# Patient Record
Sex: Female | Born: 1975 | Hispanic: No | Marital: Married | Smoking: Never smoker
Health system: Southern US, Community
[De-identification: ages and names within clinical notes are randomized; demographics above are authoritative.]

## PROBLEM LIST (undated history)

## (undated) DIAGNOSIS — I10 Essential (primary) hypertension: Secondary | ICD-10-CM

## (undated) DIAGNOSIS — E785 Hyperlipidemia, unspecified: Secondary | ICD-10-CM

## (undated) DIAGNOSIS — R51 Headache: Secondary | ICD-10-CM

## (undated) DIAGNOSIS — R519 Headache, unspecified: Secondary | ICD-10-CM

## (undated) DIAGNOSIS — T7840XA Allergy, unspecified, initial encounter: Secondary | ICD-10-CM

## (undated) DIAGNOSIS — G43909 Migraine, unspecified, not intractable, without status migrainosus: Secondary | ICD-10-CM

## (undated) HISTORY — PX: TUBAL LIGATION: SHX77

## (undated) HISTORY — PX: ABDOMINAL HYSTERECTOMY: SHX81

## (undated) HISTORY — DX: Hyperlipidemia, unspecified: E78.5

## (undated) HISTORY — DX: Allergy, unspecified, initial encounter: T78.40XA

---
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
## 1999-01-25 ENCOUNTER — Encounter (HOSPITAL_COMMUNITY): Admission: RE | Admit: 1999-01-25 | Discharge: 1999-04-20 | Payer: Self-pay | Admitting: *Deleted

## 2003-10-19 ENCOUNTER — Emergency Department (HOSPITAL_COMMUNITY): Admission: EM | Admit: 2003-10-19 | Discharge: 2003-10-19 | Payer: Self-pay | Admitting: Emergency Medicine

## 2012-09-27 ENCOUNTER — Ambulatory Visit: Payer: Self-pay | Admitting: Obstetrics and Gynecology

## 2012-09-27 LAB — CBC
HCT: 37.6 % (ref 35.0–47.0)
HGB: 12.8 g/dL (ref 12.0–16.0)
MCH: 28 pg (ref 26.0–34.0)
MCHC: 34 g/dL (ref 32.0–36.0)
MCV: 82 fL (ref 80–100)
Platelet: 283 10*3/uL (ref 150–440)
RBC: 4.58 10*6/uL (ref 3.80–5.20)
RDW: 13.4 % (ref 11.5–14.5)
WBC: 7.4 10*3/uL (ref 3.6–11.0)

## 2012-09-27 LAB — POTASSIUM: Potassium: 3.8 mmol/L (ref 3.5–5.1)

## 2012-10-12 ENCOUNTER — Ambulatory Visit: Payer: Self-pay | Admitting: Obstetrics and Gynecology

## 2012-10-16 LAB — PATHOLOGY REPORT

## 2013-10-06 ENCOUNTER — Encounter (HOSPITAL_COMMUNITY): Payer: Self-pay | Admitting: Emergency Medicine

## 2013-10-06 ENCOUNTER — Emergency Department (HOSPITAL_COMMUNITY): Payer: Worker's Compensation

## 2013-10-06 ENCOUNTER — Emergency Department (HOSPITAL_COMMUNITY)
Admission: EM | Admit: 2013-10-06 | Discharge: 2013-10-06 | Disposition: A | Discharge status: Home / Self Care | Payer: Worker's Compensation | Attending: Emergency Medicine | Admitting: Emergency Medicine

## 2013-10-06 DIAGNOSIS — X500XXA Overexertion from strenuous movement or load, initial encounter: Secondary | ICD-10-CM | POA: Insufficient documentation

## 2013-10-06 DIAGNOSIS — Y929 Unspecified place or not applicable: Secondary | ICD-10-CM | POA: Insufficient documentation

## 2013-10-06 DIAGNOSIS — IMO0002 Reserved for concepts with insufficient information to code with codable children: Secondary | ICD-10-CM | POA: Diagnosis not present

## 2013-10-06 DIAGNOSIS — I10 Essential (primary) hypertension: Secondary | ICD-10-CM | POA: Diagnosis not present

## 2013-10-06 DIAGNOSIS — S46909A Unspecified injury of unspecified muscle, fascia and tendon at shoulder and upper arm level, unspecified arm, initial encounter: Secondary | ICD-10-CM | POA: Insufficient documentation

## 2013-10-06 DIAGNOSIS — S63501A Unspecified sprain of right wrist, initial encounter: Secondary | ICD-10-CM

## 2013-10-06 DIAGNOSIS — Y9389 Activity, other specified: Secondary | ICD-10-CM | POA: Insufficient documentation

## 2013-10-06 DIAGNOSIS — S4980XA Other specified injuries of shoulder and upper arm, unspecified arm, initial encounter: Secondary | ICD-10-CM | POA: Diagnosis present

## 2013-10-06 HISTORY — DX: Essential (primary) hypertension: I10

## 2013-10-06 HISTORY — DX: Headache: R51

## 2013-10-06 HISTORY — DX: Headache, unspecified: R51.9

## 2013-10-06 MED ORDER — KETOROLAC TROMETHAMINE 10 MG PO TABS
10.0000 mg | ORAL_TABLET | Freq: Once | ORAL | Status: AC
  Administered 2013-10-06: 10 mg via ORAL
  Filled 2013-10-06: qty 1

## 2013-10-06 MED ORDER — HYDROCODONE-ACETAMINOPHEN 5-325 MG PO TABS: 1.0000 | ORAL_TABLET | ORAL | Status: DC | PRN

## 2013-10-06 MED ORDER — HYDROCODONE-ACETAMINOPHEN 5-325 MG PO TABS
2.0000 | ORAL_TABLET | Freq: Once | ORAL | Status: AC
  Administered 2013-10-06: 2 via ORAL
  Filled 2013-10-06: qty 2

## 2013-10-06 MED ORDER — DICLOFENAC SODIUM 75 MG PO TBEC: 75.0000 mg | DELAYED_RELEASE_TABLET | Freq: Two times a day (BID) | ORAL | Status: DC

## 2013-10-06 NOTE — ED Provider Notes (Signed)
CSN: [id number]     Arrival date & time 10/06/13  1740 History  This chart was scribed for a non-physician practitioner, Ivery Quale, PA-C, working with Vida Roller, MD by [place] Peace, ED Scribe. The patient was seen in APFT20/APFT20. The patient's care was started at 7:11 PM.    Chief Complaint  Patient presents with  . Arm Pain      The history is provided by the patient. No language interpreter was used.   HPI Comments: Victoria Brandt is a 38 y.o. female who presents to the Emergency Department complaining of right hand/forearm injury that occurred earlier today while she was at work and a resident twisted her right arm while she was passing out medication. Pt complains of pain and swelling to her right thumb and wrist area specifically with some numbness as well. She has not taken any medication pta.    Past Medical History  Diagnosis Date  . Hypertension   . Headache    Past Surgical History  Procedure Laterality Date  . Abdominal hysterectomy    . Tubal ligation     No family history on file. History  Substance Use Topics  . Smoking status: Never Smoker   . Smokeless tobacco: Not on file  . Alcohol Use: No   OB History   Grav Para Term Preterm Abortions TAB SAB Ect Mult Living                 Review of Systems  Constitutional: Negative for fever.  Gastrointestinal: Negative for nausea, vomiting and diarrhea.  Musculoskeletal:       Right hand/wrist pain.   Neurological: Positive for numbness (right arm).      Allergies  Bee venom  Home Medications   Prior to Admission medications   Medication Sig Start Date End Date Taking? Authorizing Provider  losartan-hydrochlorothiazide (HYZAAR) 50-12.5 MG per tablet Take 1 tablet by mouth every evening.   Yes Historical Provider, MD  SUMAtriptan (IMITREX) 100 MG tablet Take 100 mg by mouth every 2 (two) hours as needed for migraine or headache. May repeat in 2 hours if headache persists or recurs.   Yes  Historical Provider, MD   BP 177/101  Pulse 74  Temp(Src) 99.3 F (37.4 C) (Oral)  Resp 18  Ht  (1.6 m)  Wt 218 lb (98.884 kg)  BMI 38.63 kg/m2  SpO2 99% Physical Exam  Nursing note and vitals reviewed. Constitutional: She is oriented to person, place, and time. She appears well-developed and well-nourished. No distress.  HENT:  Head: Normocephalic and atraumatic.  Eyes: Conjunctivae and EOM are normal.  Neck: Neck supple. No tracheal deviation present.  Cardiovascular: Normal rate.   Pulmonary/Chest: Effort normal. No respiratory distress.  Musculoskeletal: Normal range of motion.  Full ROM of the right shoulder and right elbow. Tenderness to touch of radial portion of right forearm. No broken skin. RP 2+. Good ROM of fingers. Pain with flexion and extension of the fingers. Mild swelling of the dorsum of the hands. Pain with pronation and supination.   Neurological: She is alert and oriented to person, place, and time.  Skin: Skin is warm and dry.  Psychiatric: She has a normal mood and affect. Her behavior is normal.    ED Course  Procedures (including critical care time) Labs Review Labs Reviewed - No data to display  No results found for this or any previous visit. No results found.    Imaging Review Dg Wrist Complete Right  10/06/2013   CLINICAL DATA:  Right wrist pain and swelling following an injury today.  EXAM: RIGHT WRIST - COMPLETE 3+ VIEW  COMPARISON:  Right hand radiographs obtained at the same time.  FINDINGS: There is no evidence of fracture or dislocation. There is no evidence of arthropathy or other focal bone abnormality. Soft tissues are unremarkable.  IMPRESSION: Normal examination.   Electronically Signed   By: Gordan Payment M.D.   On: 10/06/2013 18:09   Dg Hand Complete Right  10/06/2013   CLINICAL DATA:  Hand pain and swelling  EXAM: RIGHT HAND - COMPLETE 3+ VIEW  COMPARISON:  None.  FINDINGS: Three views of the right hand submitted. No acute  fracture or subluxation. Mild degenerative changes first metacarpophalangeal joint. Mild degenerative changes distal interphalangeal joints second third and fifth finger.  IMPRESSION: No acute fracture or subluxation. Mild degenerative changes first metacarpophalangeal joint and distal interphalangeal joints.   Electronically Signed   By: Natasha Mead M.D.   On: 10/06/2013 18:12     EKG Interpretation None     Medications - No data to display  7:16 PM- Treatment plan was discussed with patient who verbalizes understanding and agrees. Pt was given x-ray results as well.   MDM X-ray of the right hand is negative for fracture or subluxation. There are degenerative changes of the MCP and DIP joints.  X-ray of the right wrist is negative for fracture or dislocation.  The examination is consistent with muscle strains sprain of the wrist forearm area. Patient is placed in the wrist forearm splint and sling. Prescription for diclofenac and Norco given to the patient. Patient referred to orthopedics for additional followup. The patient has received a work excuse to return on August 28.    Final diagnoses:  None    *I have reviewed nursing notes, vital signs, and all appropriate lab and imaging results for this patient.**  **I personally performed the services described in this documentation, which was scribed in my presence. The recorded information has been reviewed and is accurate.Kathie Dike, PA-C 10/06/13 2018

## 2013-10-06 NOTE — Discharge Instructions (Signed)
Your examination is consistent with a wrist forearm sprain. Please use the splint and sling for the next 7-10 days. Please use diclofenac 2 times daily use Norco every 4 hours if needed for pain. Norco may cause drowsiness, please use with caution. Please see Dr. Romeo Apple for additional evaluation in the office. Wrist Splint A wrist splint holds your wrist in a set position so that it does not move (fixed position). It can help broken bones and sprains heal faster, with less pain. It can also help relieve pressure on the nerve that runs down the middle of your arm (median nerve) into your fingers.  HOME CARE  Wear your splint as told by your doctor. It may be worn while you sleep.  Exercise your wrist as told by your doctor. These exercises help keep muscle strength in your hand and wrist. They also help to make sure you keep motion in your fingers. GET HELP RIGHT AWAY IF:   You start to lose feeling in your hand or fingers.  Your skin or fingernails turn blue or gray, or they feel cold. MAKE SURE YOU:   Understand these instructions.  Will watch your condition.  Will get help right away if you are not doing well or get worse. Document Released: 07/20/2007 Document Revised: 04/25/2011 Document Reviewed: 05/14/2013 [facility] Patient Information 2015 [place], [place]. This information is not intended to replace advice given to you by your health care provider. Make sure you discuss any questions you have with your health care provider.

## 2013-10-06 NOTE — ED Notes (Signed)
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 Pt reports tingling in arm.  Pt has not taken anything for pain. Swelling is noted and pt is able to move fingers.

## 2013-10-06 NOTE — ED Notes (Signed)
Pt was at work today when a resident twisted her right arm, pt c/o pain and swelling to right thumb and wrist area, cms intact distal

## 2013-10-07 NOTE — ED Provider Notes (Signed)
Medical screening examination/treatment/procedure(s) were performed by non-physician practitioner and as supervising physician I was immediately available for consultation/collaboration.    Vida Roller, MD 10/07/13 [phone]

## 2013-10-10 ENCOUNTER — Telehealth: Payer: Self-pay | Admitting: Orthopedic Surgery

## 2013-10-10 NOTE — Telephone Encounter (Signed)
Patient requests apppointment following [facility] visit, for work-related injury, of 10/06/13; discussed process of additional information and forms required prior to scheduling. Patient came to office to pick up forms; will have employer, [employer], to contact our office regarding forms and whether approved to be scheduled with our office, based on Workers [employer]  Patient aware appointment is pending this information. Patient's ph# is [phone]

## 2013-10-14 NOTE — Telephone Encounter (Signed)
Patient's employer, [employer], has the Workers Comp forms; awaiting to be completed; patient has brought in the employee/patient portion; appointment still pending.  Message left at employer, [employer], for [employer]; also for [employer].

## 2013-10-16 NOTE — Telephone Encounter (Signed)
Received employer's worker's comp information and claim# [id number].  Appointment scheduled 10/17/13, 2:00p.m.

## 2013-10-17 ENCOUNTER — Encounter: Payer: Self-pay | Admitting: Orthopedic Surgery

## 2013-10-17 ENCOUNTER — Ambulatory Visit (INDEPENDENT_AMBULATORY_CARE_PROVIDER_SITE_OTHER): Payer: Worker's Compensation | Admitting: Orthopedic Surgery

## 2013-10-17 VITALS — BP 143/91 | Ht 63.0 in | Wt 218.0 lb

## 2013-10-17 DIAGNOSIS — S63501A Unspecified sprain of right wrist, initial encounter: Secondary | ICD-10-CM

## 2013-10-17 DIAGNOSIS — S63509A Unspecified sprain of unspecified wrist, initial encounter: Secondary | ICD-10-CM

## 2013-10-17 MED ORDER — HYDROCODONE-ACETAMINOPHEN 5-325 MG PO TABS: 1.0000 | ORAL_TABLET | Freq: Four times a day (QID) | ORAL | Status: DC | PRN

## 2013-10-17 MED ORDER — IBUPROFEN 800 MG PO TABS: 800.0000 mg | ORAL_TABLET | Freq: Three times a day (TID) | ORAL | Status: DC | PRN

## 2013-10-17 NOTE — Progress Notes (Signed)
Chief Complaint  Patient presents with  . 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 She's now 11 days post injury and still complaining of pain and stiffness numbness along the radial aspect of her right wrist primarily over the first extensor compartment  Review of systems  Medical history  Physical exam BP 143/91  Ht  (1.6 m)  Wt 218 lb (98.884 kg)  BMI 38.63 kg/m2 Normal development grooming and hygiene. Mood affect normal. A normal. Right wrist tender swollen she is holding onto her very tightly she is very apprehensive painful range of motion in all planes no instability cannot test wrist strength chest opening closing maneuvers which are normal for the hand skin is intact has good pulses no lymphadenopathy normal sensation  X-rays negative  Wrist sprain  Recommend occupational therapy, continue splinting  Return to work light duty if available if not continue off work. Occupational therapy twice a week for 4 weeks (No lifting right hand greater than 2 pounds) Continue medications as follows

## 2013-10-17 NOTE — Patient Instructions (Signed)
Return to work with brace 10/19/13, light duty, no lifting over 2 lbs with right hand Call to arrange therapy

## 2013-10-22 ENCOUNTER — Telehealth: Payer: Self-pay | Admitting: Orthopedic Surgery

## 2013-10-22 NOTE — Telephone Encounter (Addendum)
Contacted Workers comp, [employer], regarding requesting approval for physical therapy, ph# [phone]. Given adjuster ph# [phone]. Fax notes and order to fax# [phone] (sent 10/23/13).  Awaiting response.  Patient aware of status.

## 2013-10-30 NOTE — Telephone Encounter (Signed)
Called patient to inquire if she has been contacted by workers comp regarding physical therapy; she has not.  I have called back to [facility], left message to return call.

## 2013-11-12 ENCOUNTER — Telehealth: Payer: Self-pay | Admitting: Orthopedic Surgery

## 2013-11-12 NOTE — Telephone Encounter (Signed)
On 11/11/13, we contacted patient to notify of appointment change, due to change in provider schedule; offered same day at another time, also offered 11/13/13, as well as 11/18/13, none of which worked with patient's class schedule.  Patient opted to move it to 11/28/13.  Updated work note issued accordingly.

## 2013-11-14 ENCOUNTER — Ambulatory Visit: Payer: Worker's Compensation | Admitting: Orthopedic Surgery

## 2013-11-18 NOTE — Telephone Encounter (Signed)
11/18/13 Received voice message from [employer] representative, ph# [phone] - left another voice message in response.

## 2013-11-19 ENCOUNTER — Other Ambulatory Visit: Payer: Self-pay | Admitting: *Deleted

## 2013-11-19 DIAGNOSIS — S63501D Unspecified sprain of right wrist, subsequent encounter: Secondary | ICD-10-CM

## 2013-11-19 NOTE — Telephone Encounter (Signed)
Per [employer], Workers Comp's 3rd party, need the therapy order to be "Physical Therapy", per request of the approved provider, [facility], [place] (order is currently entered as "occupational" if patient were to schedule at [facility].  Re-Fax to #[phone]

## 2013-11-19 NOTE — Telephone Encounter (Signed)
Therapy approved per [employer] insurance adjuster for [employer], Wardell HonourLaura Keck, ph [phone]. [employer], 3rd party contact, requests orders -- faxed to [phone],ph# [phone];  Provider information to follow.  Tried calling patient to notify - no voice mail set up.

## 2013-11-20 NOTE — Telephone Encounter (Signed)
Order had been updated per Harvel RicksJaime Booth, LPN to: "Physical Therapy" on 11/19/13 and [facility], who has forwarded copy to provider, [facility]. Messages have been left to notify patient.

## 2013-11-28 ENCOUNTER — Encounter: Payer: Self-pay | Admitting: Orthopedic Surgery

## 2013-11-28 ENCOUNTER — Ambulatory Visit (INDEPENDENT_AMBULATORY_CARE_PROVIDER_SITE_OTHER): Payer: Worker's Compensation | Admitting: Orthopedic Surgery

## 2013-11-28 VITALS — BP 158/98 | Ht 63.0 in | Wt 218.0 lb

## 2013-11-28 DIAGNOSIS — S63501A Unspecified sprain of right wrist, initial encounter: Secondary | ICD-10-CM

## 2013-11-28 MED ORDER — HYDROCODONE-ACETAMINOPHEN 5-325 MG PO TABS: 1.0000 | ORAL_TABLET | Freq: Four times a day (QID) | ORAL | Status: DC | PRN

## 2013-11-28 NOTE — Patient Instructions (Signed)
No change in work status  Start therapy

## 2013-11-28 NOTE — Progress Notes (Signed)
Chief Complaint  Patient presents with  . Follow-up    4 week recheck right wrist in brace s/p therapy DOI 10/06/13    The patient had any injury to her right wrist at work. We ordered therapy, Norco and ibuprofen and we placed her brace. She did not start therapy.  She still complains of pain weakness with pain over the right wrist right excision versus extensor compartment right wrist joint  Review of systems negative  BP 158/98  Ht 5\' 3"  (1.6 m)  Wt 218 lb (98.884 kg)  BMI 38.63 kg/m2 She has tenderness over the wrist joint and over the first extensor compartment. Finkelstein's test is positive for pain she has painful wrist range of motion which is normal. She has weak grip strength and intention tremor. Neurovascular exam is intact. Claudette LawsWatson test is negative for any instability.  Wrist joint injury with de Quervain's syndrome  Recommend continue wrist splinting, ibuprofen, Norco, start therapy. She indicates therapy is scheduled for the 21st I'll see her 4 weeks after that  Continue modified work duty and continue ibuprofen and Norco and splinting

## 2014-01-02 ENCOUNTER — Encounter: Payer: Self-pay | Admitting: Orthopedic Surgery

## 2014-01-02 ENCOUNTER — Ambulatory Visit (INDEPENDENT_AMBULATORY_CARE_PROVIDER_SITE_OTHER): Payer: Worker's Compensation | Admitting: Orthopedic Surgery

## 2014-01-02 ENCOUNTER — Telehealth: Payer: Self-pay | Admitting: Orthopedic Surgery

## 2014-01-02 VITALS — BP 163/92 | Ht 63.0 in | Wt 218.0 lb

## 2014-01-02 DIAGNOSIS — S63501D Unspecified sprain of right wrist, subsequent encounter: Secondary | ICD-10-CM

## 2014-01-02 MED ORDER — HYDROCODONE-ACETAMINOPHEN 5-325 MG PO TABS: 1.0000 | ORAL_TABLET | ORAL | Status: DC | PRN

## 2014-01-02 MED ORDER — IBUPROFEN 800 MG PO TABS: 800.0000 mg | ORAL_TABLET | Freq: Three times a day (TID) | ORAL | Status: DC | PRN

## 2014-01-02 MED ORDER — HYDROCODONE-ACETAMINOPHEN 5-325 MG PO TABS: 1.0000 | ORAL_TABLET | Freq: Four times a day (QID) | ORAL | Status: DC | PRN

## 2014-01-02 NOTE — Patient Instructions (Signed)
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

## 2014-01-02 NOTE — Telephone Encounter (Addendum)
Contacted [employer], workers Emergency planning/management officercomp insurer, left message with Wardell HonourLaura Keck, ph [phone]. who will contact [employer], 3rd party contact, requests orders -- then to be faxed to [phone],ph# [phone] * Note: Per Alcario DroughtErica at [facility], they are no longer following this claim; therefore, called back to insurer, [employer] left message at [phone]; faxed notes directly to [phone].

## 2014-01-02 NOTE — Progress Notes (Signed)
Patient ID: Victoria Brandt, female   DOB: 06/28/1974, 38 y.o.   MRN: [id number] Chief Complaint  Patient presents with  . Follow-up    5 week recheck right wrist in brace, DOI 10/06/13    Encounter Diagnosis  Name Primary?  . Right wrist sprain, subsequent encounter Yes    Follow-up visit after workers compensation injury she finally got her therapy she made improvement I reviewed her therapy notes she's almost home.  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 DISCONTD: HYDROcodone-acetaminophen (NORCO/VICODIN) 5-325 MG per tablet    Sig: Take 1 tablet by mouth every 4 (four) hours as needed.    Dispense:  20 tablet    Refill:  0  . ibuprofen (ADVIL,MOTRIN) 800 MG tablet    Sig: Take 1 tablet (800 mg total) by mouth every 8 (eight) hours as needed.    Dispense:  90 tablet    Refill:  5  . HYDROcodone-acetaminophen (NORCO/VICODIN) 5-325 MG per tablet    Sig: Take 1 tablet by mouth every 6 (six) hours as needed for moderate pain.    Dispense:  56 tablet    Refill:  0   -

## 2014-01-08 ENCOUNTER — Encounter: Payer: Self-pay | Admitting: Orthopedic Surgery

## 2014-01-08 NOTE — Telephone Encounter (Signed)
Note received from [employer], Workers Comp 3rd party contact regarding Physical/Occupational therapy. Faxed copy of order, Fax# [phone] / [phone].  Awaiting approval and provider information.  Patient aware.

## 2014-01-15 NOTE — Telephone Encounter (Signed)
Received faxed note from [employer], 3rd party contact for patient's worker's comp insurer indicating that patient is approved for occupational therapy at: [facility], [address], [place], [phone]/[place][phone]/ Fax# [phone].  Patient aware of appointment, scheduled for 01/21/14, 8:30AM. Order faxed.

## 2014-01-30 ENCOUNTER — Ambulatory Visit (INDEPENDENT_AMBULATORY_CARE_PROVIDER_SITE_OTHER): Payer: Worker's Compensation | Admitting: Orthopedic Surgery

## 2014-01-30 ENCOUNTER — Encounter: Payer: Self-pay | Admitting: Orthopedic Surgery

## 2014-01-30 VITALS — BP 151/94 | Ht 63.0 in | Wt 218.0 lb

## 2014-01-30 DIAGNOSIS — S63501D Unspecified sprain of right wrist, subsequent encounter: Secondary | ICD-10-CM

## 2014-01-30 NOTE — Progress Notes (Signed)
Patient ID: Victoria Brandt, female   DOB: 06/28/1974, 38 y.o.   MRN: [id number] Chief Complaint  Patient presents with  . Follow-up    4 week recheck right wrist  s/p therapy, DOI 10/06/13    The patient is undergoing treatment for her right wrist injury back in August. She is switched therapy departments and is doing much better with 65-75% overall improvement  He is just a little tender over the first extensor compartment decreased extension and flexion of the thumb but the thumb is stable she has excellent strength in the thumb skin is intact she has good pulse normal sensation  Continue therapy 4 weeks return in 4 weeks continue ibuprofen and Norco for discomfort or pain

## 2014-01-31 ENCOUNTER — Telehealth: Payer: Self-pay | Admitting: Orthopedic Surgery

## 2014-01-31 NOTE — Telephone Encounter (Signed)
Notes faxed, date of service 01/30/14 to Workers comp insurer, [employer], fax#  [phone], including order for additional therapy visits - patient currently in occupational therapy (approved by Workers comp) for provider: [facility], [address], [place], [place], ph# [phone], fax # [phone].

## 2014-02-01 ENCOUNTER — Emergency Department (HOSPITAL_COMMUNITY): Payer: BC Managed Care – PPO

## 2014-02-01 ENCOUNTER — Emergency Department (HOSPITAL_COMMUNITY)
Admission: EM | Admit: 2014-02-01 | Discharge: 2014-02-02 | Disposition: A | Discharge status: Home / Self Care | Payer: BC Managed Care – PPO | Attending: Emergency Medicine | Admitting: Emergency Medicine

## 2014-02-01 ENCOUNTER — Encounter (HOSPITAL_COMMUNITY): Payer: Self-pay | Admitting: Emergency Medicine

## 2014-02-01 DIAGNOSIS — S8992XA Unspecified injury of left lower leg, initial encounter: Secondary | ICD-10-CM | POA: Diagnosis present

## 2014-02-01 DIAGNOSIS — S80212A Abrasion, left knee, initial encounter: Secondary | ICD-10-CM

## 2014-02-01 DIAGNOSIS — S8002XA Contusion of left knee, initial encounter: Secondary | ICD-10-CM | POA: Insufficient documentation

## 2014-02-01 DIAGNOSIS — Z791 Long term (current) use of non-steroidal anti-inflammatories (NSAID): Secondary | ICD-10-CM | POA: Diagnosis not present

## 2014-02-01 DIAGNOSIS — Y9389 Activity, other specified: Secondary | ICD-10-CM | POA: Insufficient documentation

## 2014-02-01 DIAGNOSIS — S6991XA Unspecified injury of right wrist, hand and finger(s), initial encounter: Secondary | ICD-10-CM | POA: Diagnosis not present

## 2014-02-01 DIAGNOSIS — W108XXA Fall (on) (from) other stairs and steps, initial encounter: Secondary | ICD-10-CM | POA: Diagnosis not present

## 2014-02-01 DIAGNOSIS — S93402A Sprain of unspecified ligament of left ankle, initial encounter: Secondary | ICD-10-CM | POA: Diagnosis not present

## 2014-02-01 DIAGNOSIS — Z23 Encounter for immunization: Secondary | ICD-10-CM | POA: Insufficient documentation

## 2014-02-01 DIAGNOSIS — I10 Essential (primary) hypertension: Secondary | ICD-10-CM | POA: Insufficient documentation

## 2014-02-01 DIAGNOSIS — Y998 Other external cause status: Secondary | ICD-10-CM | POA: Insufficient documentation

## 2014-02-01 DIAGNOSIS — Y9289 Other specified places as the place of occurrence of the external cause: Secondary | ICD-10-CM | POA: Diagnosis not present

## 2014-02-01 DIAGNOSIS — G43909 Migraine, unspecified, not intractable, without status migrainosus: Secondary | ICD-10-CM | POA: Insufficient documentation

## 2014-02-01 DIAGNOSIS — M25531 Pain in right wrist: Secondary | ICD-10-CM

## 2014-02-01 DIAGNOSIS — W19XXXA Unspecified fall, initial encounter: Secondary | ICD-10-CM

## 2014-02-01 HISTORY — DX: Migraine, unspecified, not intractable, without status migrainosus: G43.909

## 2014-02-01 NOTE — ED Notes (Signed)
Pt reports falling down approx 6 steps. Pt c/o LLE pain and wrist and forearm pain.

## 2014-02-01 NOTE — ED Provider Notes (Signed)
CSN: [id number]     Arrival date & time 02/01/14  2209 History   None    Chief Complaint  Patient presents with  . Fall     (Consider location/radiation/quality/duration/timing/severity/associated sxs/prior Treatment) Patient is a 38 y.o. female presenting with fall. The history is provided by the patient.  Fall This is a new problem. The current episode started today. The problem has been gradually worsening. The symptoms are aggravated by walking. She has tried nothing for the symptoms.   Victoria Brandt is a 38 y.o. female who presents to the ED after a fall. She reports slipping and falling down about 6 steps. She complains of LLE pain and wrist and forearm pain. The injury occurred about 2 hours prior to arrival to the ED. She is currently under the care of ortho for a right wrist injury and is wearing a wrist splint. She states she did not have the splint on at the time of the fall. She re injured the right wrist and complains of pain to the left ankle and knee. She has an abrasion to the left knee. She is unsure of her last tetanus. She denies LOC. She denies neck or back pain. States she fell on her left side.   Past Medical History  Diagnosis Date  . Hypertension   . Headache   . Migraines    Past Surgical History  Procedure Laterality Date  . Abdominal hysterectomy    . Tubal ligation     Family History  Problem Relation Age of Onset  . Hypertension Mother   . Rheum arthritis Father   . Hypertension Father   . Heart failure Sister   . Migraines Sister   . Hypertension Sister   . Thyroid disease Sister    History  Substance Use Topics  . Smoking status: Never Smoker   . Smokeless tobacco: Not on file  . Alcohol Use: No   OB History    Gravida Para Term Preterm AB TAB SAB Ectopic Multiple Living   2 2 1 1            Review of Systems  Musculoskeletal:       Right wrist/forearm pain, left ankle and knee pain. Abrasion to left knee.   all other systems  negative.     Allergies  Bee venom  Home Medications   Prior to Admission medications   Medication Sig Start Date End Date Taking? Authorizing Provider  diclofenac (VOLTAREN) 75 MG EC tablet Take 1 tablet (75 mg total) by mouth 2 (two) times daily. 10/06/13   Kathie DikeHobson M Bryant, PA-C  HYDROcodone-acetaminophen (NORCO/VICODIN) 5-325 MG per tablet Take 1 tablet by mouth every 6 (six) hours as needed for moderate pain. 01/02/14   Vickki HearingStanley E Harrison, MD  ibuprofen (ADVIL,MOTRIN) 800 MG tablet Take 1 tablet (800 mg total) by mouth every 8 (eight) hours as needed. 01/02/14   Vickki HearingStanley E Harrison, MD  losartan-hydrochlorothiazide (HYZAAR) 50-12.5 MG per tablet Take 1 tablet by mouth every evening.    Historical Provider, MD  SUMAtriptan (IMITREX) 100 MG tablet Take 100 mg by mouth every 2 (two) hours as needed for migraine or headache. May repeat in 2 hours if headache persists or recurs.    Historical Provider, MD   BP 144/77 mmHg  Pulse 79  Temp(Src) 98.6 F (37 C) (Oral)  Resp 20  Ht 5\' 3"  (1.6 m)  Wt 218 lb (98.884 kg)  BMI 38.63 kg/m2  SpO2 100% Physical Exam  Constitutional: She  is oriented to person, place, and time. She appears well-developed and well-nourished.  HENT:  Head: Normocephalic and atraumatic.  Mouth/Throat: Uvula is midline, oropharynx is clear and moist and mucous membranes are normal.  Eyes: Conjunctivae and EOM are normal. Pupils are equal, round, and reactive to light.  Neck: Neck supple.  Cardiovascular: Normal rate.   Pulmonary/Chest: Effort normal.  Abdominal: Soft. There is no tenderness.  Musculoskeletal:       Right wrist: She exhibits tenderness. She exhibits normal range of motion, no swelling, no deformity and no laceration.       Left knee: She exhibits laceration. She exhibits no deformity, no erythema, normal alignment and normal patellar mobility. Decreased range of motion: due to pain. Swelling: minimal. Tenderness found.       Left ankle: She exhibits  swelling. She exhibits normal range of motion, no deformity, no laceration and normal pulse. Tenderness. Lateral malleolus tenderness found. Achilles tendon normal.       Legs:      Feet:  Radial pulses equal, adequate circulation. Full range of motion of the wrist with some pain. Pain on palpation.  Left knee tender with flexion. 2 cm abrasion noted. Pedal pulses equal, adequate circulation, good touch sensation.   Neurological: She is alert and oriented to person, place, and time. No cranial nerve deficit.  Skin: Skin is warm and dry.  Psychiatric: She has a normal mood and affect. Her behavior is normal.  Nursing note and vitals reviewed.   ED Course  Procedures Dg Forearm Right  02/01/2014   CLINICAL DATA:  Larey SeatFell, pain  EXAM: RIGHT FOREARM - 2 VIEW  COMPARISON:  None.  FINDINGS: There is no evidence of fracture or other focal bone lesions. Soft tissues are unremarkable. Incidental olecranon spurring.  IMPRESSION: Negative for fracture.   Electronically Signed   By: Davonna BellingJohn  Curnes M.D.   On: 02/01/2014 23:34   Dg Wrist Complete Right  02/01/2014   CLINICAL DATA:  Larey SeatFell, pain  EXAM: RIGHT WRIST - COMPLETE 3+ VIEW  COMPARISON:  None.  FINDINGS: There is no evidence of fracture or dislocation. There is no evidence of arthropathy or other focal bone abnormality. Soft tissues are unremarkable.  IMPRESSION: Negative.   Electronically Signed   By: Davonna BellingJohn  Curnes M.D.   On: 02/01/2014 23:31   Dg Tibia/fibula Left  02/01/2014   CLINICAL DATA:  Status post fall from top of wooden steps down to car. Acute onset of left knee and lower leg pain, with laceration overlying the left patella. Initial encounter.  EXAM: LEFT TIBIA AND FIBULA - 2 VIEW  COMPARISON:  None.  FINDINGS: There is no evidence of fracture or dislocation. The tibia and fibula appear intact. The ankle mortise is grossly unremarkable in appearance. A small knee joint effusion is noted.  A posterior calcaneal spur is incidentally seen. The  known soft tissue laceration is not well characterized on radiograph.  IMPRESSION: No evidence of fracture or dislocation. Small knee joint effusion noted.   Electronically Signed   By: Roanna RaiderJeffery  Chang M.D.   On: 02/01/2014 23:32   Dg Knee Complete 4 Views Left  02/01/2014   CLINICAL DATA:  Status post fall from top of wooden steps down to car. Acute onset of left knee and lower leg pain, with laceration overlying the left patella. Initial encounter.  EXAM: LEFT KNEE - COMPLETE 4+ VIEW  COMPARISON:  None.  FINDINGS: There is no evidence of fracture or dislocation. The joint spaces are preserved. Marginal  osteophytes are seen arising at the medial compartment; the patellofemoral joint is grossly unremarkable in appearance.  A small knee joint effusion is seen. The known soft tissue laceration is not well characterized on radiograph.  IMPRESSION: 1. No evidence of fracture or dislocation. 2. Small knee joint effusion noted.   Electronically Signed   By: Roanna Raider M.D.   On: 02/01/2014 23:31    MDM  38 y.o. female with ankle, wrist, forearm and knee pain s/p fall. Stable for discharge without neurovascular deficits. Abrasion to left knee cleaned, bacitracin ointment and dressing applied, ace wrap. Tetanus updated. Patient to continue to wear the right wrist/forearm splint. ASO to left ankle, ice, elevation and follow up with Dr. Romeo Apple who is treating her previous wrist injury. Discussed with the patient clinical and x-ray findings and plan of care. She voices understanding and agrees with plan. All questioned fully answered. She has pain medication that she is currently taking.  .  Final diagnoses:  Fall down steps, initial encounter  Left ankle sprain, initial encounter  Contusion of left knee, initial encounter  Abrasion of left knee, initial encounter  Right wrist pain      Janne Napoleon, NP 02/02/14 1705  Layla Maw Ward, DO 02/02/14 1814

## 2014-02-02 MED ORDER — BACITRACIN-NEOMYCIN-POLYMYXIN 400-5-5000 EX OINT
TOPICAL_OINTMENT | Freq: Once | CUTANEOUS | Status: AC
  Administered 2014-02-02: 1 via TOPICAL
  Filled 2014-02-02: qty 1

## 2014-02-02 MED ORDER — TETANUS-DIPHTH-ACELL PERTUSSIS 5-2.5-18.5 LF-MCG/0.5 IM SUSP
0.5000 mL | Freq: Once | INTRAMUSCULAR | Status: AC
  Administered 2014-02-02: 0.5 mL via INTRAMUSCULAR
  Filled 2014-02-02: qty 0.5

## 2014-02-02 MED ORDER — OXYCODONE-ACETAMINOPHEN 5-325 MG PO TABS
1.0000 | ORAL_TABLET | Freq: Once | ORAL | Status: AC
  Administered 2014-02-02: 1 via ORAL
  Filled 2014-02-02: qty 1

## 2014-02-02 NOTE — Discharge Instructions (Signed)
Follow up with your doctor that is taking care of your wrist injury. Continue to take your medication for pain and inflammation.

## 2014-02-12 NOTE — Telephone Encounter (Signed)
02/12/14 Called to follow up on status of additional therapy; per Angie at [facility], they received approval and have patient scheduled for continuing therapy visits, per order 01/30/14 per Workers Comp's 3rd party contact, and patient is scheduled for appointment today through 02/24/14, and patient is aware.

## 2014-02-18 ENCOUNTER — Ambulatory Visit: Payer: BC Managed Care – PPO | Admitting: Orthopedic Surgery

## 2014-02-27 ENCOUNTER — Encounter: Payer: Self-pay | Admitting: Orthopedic Surgery

## 2014-02-27 ENCOUNTER — Ambulatory Visit (INDEPENDENT_AMBULATORY_CARE_PROVIDER_SITE_OTHER): Payer: Worker's Compensation | Admitting: Orthopedic Surgery

## 2014-02-27 VITALS — BP 149/93 | Ht 63.0 in | Wt 218.0 lb

## 2014-02-27 DIAGNOSIS — S63501D Unspecified sprain of right wrist, subsequent encounter: Secondary | ICD-10-CM

## 2014-02-27 DIAGNOSIS — M654 Radial styloid tenosynovitis [de Quervain]: Secondary | ICD-10-CM

## 2014-02-27 NOTE — Patient Instructions (Signed)
Extend work note 3 more weeks

## 2014-02-27 NOTE — Progress Notes (Signed)
Subjective:     Patient ID: Victoria Brandt, female   DOB: 06/28/1974, 39 y.o.   MRN: [id number]  HPI Comments: Recheck right wrist status post trauma at work. Treated with occupational therapy, Norco, ibuprofen, brace, comes in still complaining of pain in the first extensor compartment as well as the first interspace with swelling in that area including the dorsum of the hand    Review of Systems  Skin: Negative for color change.  Neurological: Negative for numbness.       Objective:   Physical Exam BP 149/93 mmHg  Ht 5\' 3"  (1.6 m)  Wt 218 lb (98.884 kg)  BMI 38.63 kg/m2 The patient is a good mood today she is oriented to person place and time her appearance is normal  Examination of the right wrist shows a positive Finkelstein test tenderness over the first extensor compartment. She has swelling and tenderness in the first interosseous muscle and swelling in the index finger and thumb area and the dorsum of the hand. She has painful ulnar deviation but it is full she has normal flexion extension of the wrist. Wrist joint stable. Extensor tendons of the thumb normal. Skin is normal. Good color capillary refill and temperature normal sensation.    Assessment:     Encounter Diagnoses  Name Primary?  . Right wrist sprain, subsequent encounter   . De Quervain's syndrome (tenosynovitis) Yes    She has refractory de Quervain's syndrome some swelling in the first interosseous muscle area which I can't explain his is some type of neurologic dysfunction of the radial sensory nerve branches question.     Plan:     Injection continue Norco and ibuprofen continue work restrictions follow-up 3 weeks  Procedure note  Injection  Verbal consent was obtained to inject the  first extensor compartment of the right wrist  Timeout procedure was completed to confirm injection site  Diagnosis de Quervain's syndrome  Medications used Depo-Medrol 40 mg 1 cc Lidocaine 1% plain 3  cc  Anesthesia was provided by ethyl chloride spray  Prep was performed with alcohol  Technique of injection the 25-gauge needle was placed into the first extensor compartment tendon sheath and the medication was injected  No complications were noted

## 2014-03-20 ENCOUNTER — Telehealth: Payer: Self-pay | Admitting: Orthopedic Surgery

## 2014-03-20 ENCOUNTER — Ambulatory Visit (INDEPENDENT_AMBULATORY_CARE_PROVIDER_SITE_OTHER): Payer: Worker's Compensation | Admitting: Orthopedic Surgery

## 2014-03-20 ENCOUNTER — Encounter: Payer: Self-pay | Admitting: Orthopedic Surgery

## 2014-03-20 VITALS — BP 153/87 | Ht 63.0 in | Wt 218.0 lb

## 2014-03-20 DIAGNOSIS — M654 Radial styloid tenosynovitis [de Quervain]: Secondary | ICD-10-CM

## 2014-03-20 DIAGNOSIS — S63501D Unspecified sprain of right wrist, subsequent encounter: Secondary | ICD-10-CM

## 2014-03-20 NOTE — Addendum Note (Signed)
Addended by: Adella HareBOOTHE, Tylin Stradley B on: 03/20/2014 11:24 AM   Modules accepted: Level of Service

## 2014-03-20 NOTE — Progress Notes (Signed)
Patient ID: Shellee MiloSandra L Feldkamp, female   DOB: 06/28/1974, 39 y.o.   MRN: [id number] Chief Complaint  Patient presents with  . Follow-up    3 week recheck on right wrist after injection and therapy.   Original history HPI Comments: Shellee MiloSandra L Cayson is a 39 y.o. female who presents to the Emergency Department complaining of right hand/forearm injury that occurred earlier today while she was at work and a resident twisted her right arm while she was passing out medication. Pt complains of pain and swelling to her right thumb and wrist area specifically with some numbness as well. She has not taken any medication pta.   Doing okay. Wearing brace less and less. Her main problem is swelling in the first interosseous space. I cannot really quantify or delineate why. She is not wearing the brace I don't think the brace is causing any swelling pain over the first extensor compartment has improved significantly with mild discomfort on ulnar and radial deviation.  System review is negative  Medications no side effects  Exam shows swelling in the first interosseous space but normal pinch normal opposition. Normal wrist flexion extension mild discomfort on ulnar radial deviation. No weakness. Neurovascular exam intact skin is normal without rash. Patient is awake alert and oriented 3 mood and affect are normal  Follow-up one month continue work restrictions use ice for swelling. Continue current medications.

## 2014-03-20 NOTE — Telephone Encounter (Signed)
Notes, date of service 03/20/14 faxed to Workers comp [phone]

## 2014-03-20 NOTE — Patient Instructions (Signed)
Continue work restrictions 1 month  follow-up one month  continue current medications use ice for swelling

## 2014-04-22 ENCOUNTER — Telehealth: Payer: Self-pay | Admitting: Orthopedic Surgery

## 2014-04-22 ENCOUNTER — Encounter: Payer: Self-pay | Admitting: Orthopedic Surgery

## 2014-04-22 ENCOUNTER — Ambulatory Visit (INDEPENDENT_AMBULATORY_CARE_PROVIDER_SITE_OTHER): Payer: Worker's Compensation | Admitting: Orthopedic Surgery

## 2014-04-22 VITALS — BP 149/90 | Ht 63.0 in | Wt 218.0 lb

## 2014-04-22 DIAGNOSIS — G5601 Carpal tunnel syndrome, right upper limb: Secondary | ICD-10-CM

## 2014-04-22 DIAGNOSIS — M654 Radial styloid tenosynovitis [de Quervain]: Secondary | ICD-10-CM

## 2014-04-22 NOTE — Progress Notes (Signed)
Established patient follow-up visit  Patient ID: Victoria Brandt, female   DOB: 06/28/1974, 39 y.o.   MRN: [id number]  Chief Complaint  Patient presents with  . Follow-up    1 month recheck right wrist s/p therapy, DOI 10/06/13    HPI Victoria Brandt is a 39 y.o. female.  the patient received injection for her de Quervain's syndrome of the right wrist she's had splinting, bracing, therapy, diclofenac, now on ibuprofen and hydrocodone but complains now of numbness in the ring finger long finger and index finger with improvement of the de Quervain's symptoms but still has swelling of the wrist   Past Medical History  Diagnosis Date  . Hypertension   . Headache   . Migraines     Past Surgical History  Procedure Laterality Date  . Abdominal hysterectomy    . Tubal ligation       Allergies  Allergen Reactions  . Bee Venom Shortness Of Breath and Swelling    Current Outpatient Prescriptions  Medication Sig Dispense Refill  . HYDROcodone-acetaminophen (NORCO/VICODIN) 5-325 MG per tablet Take 1 tablet by mouth every 6 (six) hours as needed for moderate pain. 56 tablet 0  . ibuprofen (ADVIL,MOTRIN) 800 MG tablet Take 1 tablet (800 mg total) by mouth every 8 (eight) hours as needed. 90 tablet 5  . losartan-hydrochlorothiazide (HYZAAR) 50-12.5 MG per tablet Take 1 tablet by mouth every evening.    . SUMAtriptan (IMITREX) 100 MG tablet Take 100 mg by mouth every 2 (two) hours as needed for migraine or headache. May repeat in 2 hours if headache persists or recurs.    . diclofenac (VOLTAREN) 75 MG EC tablet Take 1 tablet (75 mg total) by mouth 2 (two) times daily. (Patient not taking: Reported on 04/22/2014) 14 tablet 0   No current facility-administered medications for this visit.    Review of Systems Review of Systems  numbness tingling as described no evidence of weakness no fever chills   Physical Exam Blood pressure 149/90, height 5\' 3"  (1.6 m), weight 218 lb (98.884  kg). Physical Exam She has some slight discoloration of the skin from the injection for the de Quervain's syndrome motor exam is normal she has numbness and tingling in the index long and ring finger of the right hand there is swelling of the wrist range of motion is normal she has palpable tenderness over the carpal tunnel. Otherwise normal mood and affect normal appearance good pulse in the radial artery. Data Reviewed none   Assessment    Resolving de Quervain's syndrome, new onset carpal tunnel syndrome    Plan recommend carpal tunnel splint follow-up one month wear at night. Continue current medications.             Fuller CanadaStanley Mayley Lish 04/22/2014, 11:47 AM

## 2014-04-22 NOTE — Telephone Encounter (Signed)
Notes, date of service 04/22/14/faxed to Workers comp [phone]/alternate fax [phone]

## 2014-06-06 NOTE — Op Note (Signed)
PATIENT NAME:  Victoria Brandt, Victoria Brandt MR#:  [id number] DATE OF BIRTH:  06/28/1974  DATE OF PROCEDURE:  10/12/2012  PREOPERATIVE DIAGNOSIS: Abnormal uterine bleeding.   POSTOPERATIVE DIAGNOSIS: Abnormal uterine bleeding.   PROCEDURES:  1.  Dilation and curettage.  2.  Hysteroscopy.  3.  NovaSure endometrial ablation.   ANESTHESIA: General, LMA.   SURGEON: Prentice Docker, M.D.   ESTIMATED BLOOD LOSS: Minimal.   OPERATIVE FLUIDS: 700 mL crystalloid.   COMPLICATIONS: None.   SPECIMENS: Endometrial curettings.   FINDINGS:  1.  Normal-appearing uterine cavity with measurements of 4 cm for cavity depth and 3.4 cm for cavity width.  2.  Global ablation of the cavity post ablation.  CONDITION AT THE END OF PROCEDURE: Stable.   PROCEDURE IN DETAIL: The patient was met in the preoperative area and the procedure was reviewed and her questions were answered. She was taken to the operating room where general anesthesia was administered and found to be adequate. She was placed in the dorsal high lithotomy position and prepped and draped in the usual sterile fashion. A timeout was called and then a red rubber catheter was placed. It was introduced into her bladder and her bladder was drained for approximately 100 mL of clear urine. A sterile speculum was placed in the vagina and a single-tooth tenaculum was used to grasp the anterior lip of the cervix. The uterus was sounded to a depth of 7 cm. The cervical length was measured to be a length of 3 cm. The hysteroscope was then gently introduced into the cervix after the cervix was serially dilated to 7 mm using the Hegar dilators. The above-noted findings were noted upon entry into the uterine cavity with the hysteroscope. The hysteroscope was removed and a gentle curettage was performed of the endometrial cavity.    The NovaSure device was then used according to the manufacturer's recommendations. First the cavity width was assessed after setting the  correct cavity depth. Next, the device was tested to pass cavity assessment, which it did without difficulty. Next, the device was activated and the device ran for 101 seconds prior to the end of the ablation. The device was then removed and the hysteroscope was gently reintroduced into the cavity with the above-noted findings without evidence of cavity perforation. At this point, the procedure was terminated. All instrumentation was removed and hemostasis of the cervix was assured using silver nitrate at the single-tooth tenaculum sites.   The patient tolerated the procedure well. Sponge, lap, and needle counts were correct x 2. For VTE prophylaxis, the patient was wearing pneumatic compression stockings throughout the entire procedure. She was awakened in the operating room and taken to the recovery area in stable condition.    ____________________________ Will Bonnet, MD sdj:aw D: 10/12/2012 10:13:00 ET T: 10/12/2012 10:20:06 ET JOB#: [id number]  cc: Will Bonnet, MD, <Dictator> Will Bonnet MD ELECTRONICALLY SIGNED 10/18/2012 10:45

## 2014-06-10 ENCOUNTER — Encounter: Payer: Self-pay | Admitting: Orthopedic Surgery

## 2014-06-10 ENCOUNTER — Ambulatory Visit (INDEPENDENT_AMBULATORY_CARE_PROVIDER_SITE_OTHER): Payer: Worker's Compensation | Admitting: Orthopedic Surgery

## 2014-06-10 VITALS — BP 130/87 | Ht 63.0 in | Wt 218.0 lb

## 2014-06-10 DIAGNOSIS — M654 Radial styloid tenosynovitis [de Quervain]: Secondary | ICD-10-CM | POA: Diagnosis not present

## 2014-06-10 DIAGNOSIS — G5601 Carpal tunnel syndrome, right upper limb: Secondary | ICD-10-CM | POA: Diagnosis not present

## 2014-06-10 MED ORDER — HYDROCODONE-ACETAMINOPHEN 5-325 MG PO TABS: 1.0000 | ORAL_TABLET | Freq: Four times a day (QID) | ORAL | Status: DC | PRN

## 2014-06-10 NOTE — Patient Instructions (Signed)
We will get nerve conduction study approve by workmans comp and call you with appt

## 2014-06-10 NOTE — Progress Notes (Signed)
Patient ID: Victoria Brandt, female   DOB: 06/28/1974, 39 y.o.   MRN: [id number] Chief Complaint  Patient presents with  . Follow-up    6 week recheck right wrist, DOI 10/06/13   No new findings on review of systems inquiry regarding musculoskeletal system   Today's complaints include the following. Numbness in the fingers index long portion of the thumb with swelling along the thenar dorsal interosseous muscle and dropping small objects while in the hand. She has tenderness over the carpal tunnel painful carpal tunnel compression test decreased sensation in the index and long finger slightly positive de Quervain's test mild swelling over the first interosseous muscle otherwise awake alert oriented mood affect normal vital signs stable  Recommend carpal tunnel testing with a nerve conduction study continue Norco.  Meds ordered this encounter  Medications  . HYDROcodone-acetaminophen (NORCO/VICODIN) 5-325 MG per tablet    Sig: Take 1 tablet by mouth every 6 (six) hours as needed for moderate pain.    Dispense:  56 tablet    Refill:  0

## 2014-06-11 ENCOUNTER — Telehealth: Payer: Self-pay | Admitting: Orthopedic Surgery

## 2014-06-11 NOTE — Telephone Encounter (Signed)
Notes, date of service faxed to Workers comp [phone] + copy to nurse case manager Meryl Dareheryl Hooker, fax [phone] -- included request for approval and referral for NCS/EMG.  Patient aware of status.

## 2014-06-26 NOTE — Telephone Encounter (Signed)
Called back to Workers Comp insurer [employer], ph# [phone], adjuster Wardell HonourLaura Keck; left voice message, checking on status of referral for NCS/EMG; notes,referral had been faxed 06/11/14 to both insurer and nurse case manager as noted.

## 2014-07-02 NOTE — Telephone Encounter (Signed)
Received call back from Workers Comp adjuster Wardell HonourLaura Keck, relaying that they have patient scheduled for her IME - Independent medical exam, for 07/21/14, and are holding on approving the Nerve conduction study until after that.  States patient has been assigned a Financial risk analystnurse case manager.  Patient has been made aware of this information.

## 2014-07-22 ENCOUNTER — Telehealth: Payer: Self-pay | Admitting: Orthopedic Surgery

## 2014-07-22 NOTE — Telephone Encounter (Signed)
Patient called to relay that she was referred by her Workers Comp insurer [employer] to another orthopedist for a second opinion.  She states she saw him today, Dr. Cinda QuestGaul, in [place], and that we should be receiving a copy of the report.  Patient said that Dr Cinda QuestGaul agreed with Dr. Romeo AppleHarrison, and that we should be hearing back from Workers Comp regarding the nerve conduction study, as she states "they are not transferring to the other doctor."  Patient's ph# is [phone].

## 2014-08-06 ENCOUNTER — Other Ambulatory Visit: Payer: Self-pay | Admitting: *Deleted

## 2014-08-06 DIAGNOSIS — R202 Paresthesia of skin: Secondary | ICD-10-CM

## 2014-08-06 DIAGNOSIS — R200 Anesthesia of skin: Secondary | ICD-10-CM

## 2014-08-07 ENCOUNTER — Encounter: Payer: Self-pay | Admitting: Orthopedic Surgery

## 2014-08-07 NOTE — Telephone Encounter (Signed)
Copy of 2nd opinion consult by orthopaedic surgeon, Theresia Lo, MD, [place], [place], received per Workers comp case manager, Jake Michaelis, ph# [phone], fax# [phone].  Dr Romeo Apple has reviewed report, and has ordered NCV with EMG study, and also a thumb spica D ring splint.  The orders have been sent to Ms. Barry Dienes to approve and schedule; patient is aware (voice message left) 08/07/14.

## 2014-08-13 NOTE — Telephone Encounter (Signed)
08/13/14 Received response to faxed orders per call back from Workers comp case manager Jake MichaelisReitta Owens,  ph# [phone] and fax# [phone].  She is asking why Dr Romeo AppleHarrison does not wish to order the MRI at this time. She is working on scheduling the nerve conduction study and ordering the brace as noted.

## 2014-08-14 NOTE — Telephone Encounter (Signed)
i ll have to take care of this after the holiday   I DIDN'T ORDER MRI BECAUSE I DIDN'T THINK IT WAS NEEDED? IF HE WANTS IT  HE SHOULD ORDER IT

## 2014-08-15 NOTE — Telephone Encounter (Signed)
I relayed this information regarding MRI to Jake Michaeliseitta Owens, nurse case manager, and she states that is fine that Dr. Romeo AppleHarrison felt not necessary to order.  States she is working on setting up the nerve conduction study and ordering of brace; patient aware of status.

## 2014-08-26 NOTE — Telephone Encounter (Signed)
Received call from [employer] 3rd party pre-cert, [employer], relaying that patient has been approved and scheduled for EMG/Nerve conduction study appointment, and patient is aware:  09/03/14, 2:00PM, at location: [facility], [address], [place], [place]/PH# [phone]/Fax# [phone] - order faxed there.

## 2014-09-02 NOTE — Telephone Encounter (Signed)
Patient's follow up appointment to review study results is scheduled for 09/30/14, 10:30am; patient aware.

## 2014-09-30 ENCOUNTER — Ambulatory Visit: Payer: Worker's Compensation | Admitting: Orthopedic Surgery

## 2014-10-09 ENCOUNTER — Ambulatory Visit: Payer: Worker's Compensation | Admitting: Orthopedic Surgery

## 2014-10-16 ENCOUNTER — Ambulatory Visit: Payer: Worker's Compensation | Admitting: Orthopedic Surgery

## 2014-10-30 ENCOUNTER — Ambulatory Visit (INDEPENDENT_AMBULATORY_CARE_PROVIDER_SITE_OTHER): Payer: Worker's Compensation | Admitting: Orthopedic Surgery

## 2014-10-30 ENCOUNTER — Encounter: Payer: Self-pay | Admitting: Orthopedic Surgery

## 2014-10-30 VITALS — BP 149/101 | Ht 63.0 in | Wt 255.0 lb

## 2014-10-30 DIAGNOSIS — M654 Radial styloid tenosynovitis [de Quervain]: Secondary | ICD-10-CM

## 2014-10-30 MED ORDER — IBUPROFEN 800 MG PO TABS: 800.0000 mg | ORAL_TABLET | Freq: Three times a day (TID) | ORAL | Status: DC | PRN

## 2014-10-30 NOTE — Patient Instructions (Signed)
Recommend referral to hand specialist

## 2014-10-30 NOTE — Progress Notes (Signed)
Patient ID: Victoria Brandt, female   DOB: 06/28/1974, 39 y.o.   MRN: [id number]  Follow up visit  Chief Complaint  Patient presents with  . Results    NCS RESULTS REVIEW    BP 149/101 mmHg  Ht  (1.6 m)  Wt 255 lb (115.667 kg)  BMI 45.18 kg/m2  No diagnosis found.  The patient finally got her nerve study in [facility] it showed a normal nerve conduction study. She had an IME and they recommended MRI which I actually said was not needed but it was gotten anyway and that reports is mild tenosynovitis of the first extensor compartment distal radial ulnar joint with a small ganglion cyst on the volar aspect (not where the patient is having discomfort) of the distal radius without tear of the scapholunate ligament.  The patient complains of numbness in the thumb index and long finger with lack of sensation to palpation. She can make a full fist but she has swelling in this area over the dorsal interosseous muscle between the first and second metacarpals.  At this point I don't really have anything to add on think the MRI really told as anything other than ruling out what we knew clinically the nerve conduction study rules out carpal tunnel syndrome.  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  But at this point one year later I think she should see a subspecialist a hand surgeon to make treatment recommendations.  Continue ibuprofen for the inflammation continue to work restrictions  The next appointment will be arranged after the hand specialist makes his recommendations and if any surgery is needed I'm happy to do it as long as it's procedure I routinely do if not then I would not refer to the hand surgeon to make the surgical treatment performed the surgical treatment  Time spent 17 minutes

## 2014-11-18 ENCOUNTER — Encounter: Payer: Self-pay | Admitting: Orthopedic Surgery

## 2015-02-19 DIAGNOSIS — M79641 Pain in right hand: Secondary | ICD-10-CM | POA: Insufficient documentation

## 2015-09-14 ENCOUNTER — Other Ambulatory Visit (HOSPITAL_COMMUNITY): Payer: Self-pay | Admitting: Family Medicine

## 2015-09-14 DIAGNOSIS — Z1231 Encounter for screening mammogram for malignant neoplasm of breast: Secondary | ICD-10-CM

## 2015-09-21 ENCOUNTER — Ambulatory Visit (HOSPITAL_COMMUNITY): Payer: Self-pay

## 2017-05-22 ENCOUNTER — Other Ambulatory Visit: Payer: Self-pay | Admitting: Family Medicine

## 2017-05-22 DIAGNOSIS — Z1231 Encounter for screening mammogram for malignant neoplasm of breast: Secondary | ICD-10-CM

## 2018-11-23 ENCOUNTER — Other Ambulatory Visit: Payer: Self-pay

## 2018-11-23 ENCOUNTER — Encounter (HOSPITAL_COMMUNITY): Payer: Self-pay

## 2018-11-23 ENCOUNTER — Emergency Department (HOSPITAL_COMMUNITY)
Admission: EM | Admit: 2018-11-23 | Discharge: 2018-11-23 | Disposition: A | Discharge status: Home / Self Care | Payer: BC Managed Care – PPO | Attending: Emergency Medicine | Admitting: Emergency Medicine

## 2018-11-23 DIAGNOSIS — M5489 Other dorsalgia: Secondary | ICD-10-CM | POA: Diagnosis present

## 2018-11-23 DIAGNOSIS — I10 Essential (primary) hypertension: Secondary | ICD-10-CM | POA: Insufficient documentation

## 2018-11-23 DIAGNOSIS — M5431 Sciatica, right side: Secondary | ICD-10-CM | POA: Diagnosis not present

## 2018-11-23 MED ORDER — ACETAMINOPHEN 500 MG PO TABS
1000.0000 mg | ORAL_TABLET | Freq: Once | ORAL | Status: AC
  Administered 2018-11-23: 1000 mg via ORAL
  Filled 2018-11-23: qty 2

## 2018-11-23 MED ORDER — METHOCARBAMOL 500 MG PO TABS
500.0000 mg | ORAL_TABLET | Freq: Once | ORAL | Status: AC
  Administered 2018-11-23: 500 mg via ORAL
  Filled 2018-11-23: qty 1

## 2018-11-23 MED ORDER — PREDNISONE 50 MG PO TABS
60.0000 mg | ORAL_TABLET | Freq: Once | ORAL | Status: AC
  Administered 2018-11-23: 60 mg via ORAL
  Filled 2018-11-23: qty 1

## 2018-11-23 MED ORDER — KETOROLAC TROMETHAMINE 60 MG/2ML IM SOLN
30.0000 mg | Freq: Once | INTRAMUSCULAR | Status: AC
  Administered 2018-11-23: 30 mg via INTRAMUSCULAR
  Filled 2018-11-23: qty 2

## 2018-11-23 MED ORDER — IBUPROFEN 800 MG PO TABS: 800.0000 mg | ORAL_TABLET | Freq: Three times a day (TID) | ORAL | 0 refills | Status: DC

## 2018-11-23 MED ORDER — PREDNISONE 20 MG PO TABS: ORAL_TABLET | ORAL | 0 refills | Status: DC

## 2018-11-23 MED ORDER — METHOCARBAMOL 500 MG PO TABS: 500.0000 mg | ORAL_TABLET | Freq: Two times a day (BID) | ORAL | 0 refills | Status: DC

## 2018-11-23 NOTE — ED Provider Notes (Signed)
Emergency Department Provider Note   I have reviewed the triage vital signs and the nursing notes.   HISTORY  Chief Complaint Back Pain   HPI Victoria Brandt is a 43 y.o. female who presents to the emergency department today with thigh pain.  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  She states that she started having some right thigh cramping that progressively worsened and radiated up to her back causing similar symptoms in her back.  Hurts to walk and feels like she has a charley horse from some type of cramp.  No posterior leg pain.  No midline back pain.  No urinary symptoms.  No recent fever or cough.  No IV drug use.  No history of cancer.  Patient states that she has never had a problem with her back in the past.  No urinary or fecal incontinence.  No neurologic changes.   No other associated or modifying symptoms.    Past Medical History:  Diagnosis Date  . Headache   . Hypertension   . Migraines     There are no active problems to display for this patient.   Past Surgical History:  Procedure Laterality Date  . ABDOMINAL HYSTERECTOMY    . TUBAL LIGATION      Current Outpatient Rx  . Order #: [id number] Class: Historical Med  . Order #: [id number] Class: Historical Med  . Order #: [id number] Class: Print  . Order #: [id number] Class: Historical Med  . Order #: [id number] Class: Historical Med  . Order #: [id number] Class: Historical Med  . Order #: [id number] Class: Normal  . Order #: [id number] Class: Normal  . Order #: [id number] Class: Normal    Allergies Bee venom  Family History  Problem Relation Age of Onset  . Hypertension Mother   . Rheum arthritis Father   . Hypertension Father   . Heart failure Sister   . Migraines Sister   . Hypertension Sister   . Thyroid disease Sister     Social History Social History   Tobacco Use  . Smoking status: Never Smoker  . Smokeless tobacco: Never Used   Substance Use Topics  . Alcohol use: No  . Drug use: No    Review of Systems  All other systems negative except as documented in the HPI. All pertinent positives and negatives as reviewed in the HPI. ____________________________________________   PHYSICAL EXAM:  VITAL SIGNS: ED Triage Vitals  Enc Vitals Group     BP 11/23/18 0124 (!) 145/76     Pulse Rate 11/23/18 0124 72     Resp 11/23/18 0124 (!) 21     Temp 11/23/18 0124 98.5 F (36.9 C)     Temp Source 11/23/18 0124 Oral     SpO2 11/23/18 0124 100 %     Weight 11/23/18 0122 248 lb (112.5 kg)     Height 11/23/18 0122 5' 3.5" (1.613 m)     Head Circumference --      Peak Flow --      Pain Score 11/23/18 0121 6     Pain Loc --      Pain Edu? --      Excl. in GC? --     Constitutional: Alert and oriented. Well appearing and in no acute distress. Eyes: Conjunctivae are normal. PERRL. EOMI. Head: Atraumatic. Nose: No congestion/rhinnorhea. Mouth/Throat: Mucous membranes are moist.  Oropharynx non-erythematous. Neck: No stridor.  No meningeal signs.   Cardiovascular: Normal rate, regular  rhythm. Good peripheral circulation. Grossly normal heart sounds.   Respiratory: Normal respiratory effort.  No retractions. Lungs CTAB. Gastrointestinal: Soft and nontender. No distention.  Musculoskeletal: No lower extremity tenderness nor edema. No gross deformities of extremities. Anterior thigh/quadricep ttp. Mild right lumbar paraspinal ttp. Antalgic gait.  Neurologic:  Normal speech and language. No gross focal neurologic deficits are appreciated. Symmetric strength in L&R dorsiflexion, plantarflexion, knee extension, knee flexion, hip extension and hip flexion. Symmetric and slightly diminished patellar reflexes. Skin:  Skin is warm, dry and intact. No rash noted.   ____________________________________________   LABS (all labs ordered are listed, but only abnormal results are displayed)  Labs Reviewed - No data to display  ____________________________________________  RADIOLOGY  No results found.  ____________________________________________   INITIAL IMPRESSION / ASSESSMENT AND PLAN / ED COURSE  Seems muscular in nature. No red flags to indicate need for imaging. Will treat symptomatically for now.   Some improvement with robaxin and tylenol. Significant improvement with toradol and prednisone. Will treat at home for both with pcp follow up for continuation of care.      Pertinent labs & imaging results that were available during my care of the patient were reviewed by me and considered in my medical decision making (see chart for details).  A medical screening exam was performed and I feel the patient has had an appropriate workup for their chief complaint at this time and likelihood of emergent condition existing is low. They have been counseled on decision, discharge, follow up and which symptoms necessitate immediate return to the emergency department. They or their family verbally stated understanding and agreement with plan and discharged in stable condition.   ____________________________________________  FINAL CLINICAL IMPRESSION(S) / ED DIAGNOSES  Final diagnoses:  Sciatica of right side     MEDICATIONS GIVEN DURING THIS VISIT:  Medications  methocarbamol (ROBAXIN) tablet 500 mg (500 mg Oral Given 11/23/18 0208)  acetaminophen (TYLENOL) tablet 1,000 mg (1,000 mg Oral Given 11/23/18 0208)  predniSONE (DELTASONE) tablet 60 mg (60 mg Oral Given 11/23/18 0331)  ketorolac (TORADOL) injection 30 mg (30 mg Intramuscular Given 11/23/18 0332)     NEW OUTPATIENT MEDICATIONS STARTED DURING THIS VISIT:  Discharge Medication List as of 11/23/2018  4:31 AM    START taking these medications   Details  methocarbamol (ROBAXIN) 500 MG tablet Take 1 tablet (500 mg total) by mouth 2 (two) times daily., Starting Fri 11/23/2018, Normal    predniSONE (DELTASONE) 20 MG tablet 3 tabs po daily x 3 days, then  2 tabs x 3 days, then 1.5 tabs x 3 days, then 1 tab x 3 days, then 0.5 tabs x 3 days, Normal        Note:  This note was prepared with assistance of Dragon voice recognition software. Occasional wrong-word or sound-a-like substitutions may have occurred due to the inherent limitations of voice recognition software.   Gabbrielle Mcnicholas, Corene Cornea, MD 11/23/18 [phone]

## 2018-11-23 NOTE — ED Triage Notes (Signed)
Right lower back pain with shooting pain down right leg.  No recent injury or strain.

## 2021-02-12 ENCOUNTER — Ambulatory Visit
Admission: RE | Admit: 2021-02-12 | Discharge: 2021-02-12 | Disposition: A | Discharge status: Home / Self Care | Payer: BC Managed Care – PPO | Source: Ambulatory Visit | Attending: Family Medicine | Admitting: Family Medicine

## 2021-02-12 ENCOUNTER — Other Ambulatory Visit: Payer: Self-pay | Admitting: Family Medicine

## 2021-02-12 DIAGNOSIS — S0083XA Contusion of other part of head, initial encounter: Secondary | ICD-10-CM

## 2022-05-16 ENCOUNTER — Emergency Department (HOSPITAL_COMMUNITY)
Admission: EM | Admit: 2022-05-16 | Discharge: 2022-05-16 | Disposition: A | Discharge status: Home / Self Care | Payer: 59 | Attending: Emergency Medicine | Admitting: Emergency Medicine

## 2022-05-16 ENCOUNTER — Encounter (HOSPITAL_COMMUNITY): Payer: Self-pay | Admitting: Emergency Medicine

## 2022-05-16 ENCOUNTER — Other Ambulatory Visit: Payer: Self-pay

## 2022-05-16 ENCOUNTER — Emergency Department (HOSPITAL_COMMUNITY): Payer: 59

## 2022-05-16 DIAGNOSIS — S43015A Anterior dislocation of left humerus, initial encounter: Secondary | ICD-10-CM | POA: Diagnosis not present

## 2022-05-16 DIAGNOSIS — M25512 Pain in left shoulder: Secondary | ICD-10-CM | POA: Diagnosis not present

## 2022-05-16 DIAGNOSIS — W010XXA Fall on same level from slipping, tripping and stumbling without subsequent striking against object, initial encounter: Secondary | ICD-10-CM | POA: Insufficient documentation

## 2022-05-16 DIAGNOSIS — S42142A Displaced fracture of glenoid cavity of scapula, left shoulder, initial encounter for closed fracture: Secondary | ICD-10-CM | POA: Diagnosis not present

## 2022-05-16 DIAGNOSIS — S42152A Displaced fracture of neck of scapula, left shoulder, initial encounter for closed fracture: Secondary | ICD-10-CM

## 2022-05-16 DIAGNOSIS — S43492A Other sprain of left shoulder joint, initial encounter: Secondary | ICD-10-CM

## 2022-05-16 DIAGNOSIS — S43432A Superior glenoid labrum lesion of left shoulder, initial encounter: Secondary | ICD-10-CM | POA: Diagnosis not present

## 2022-05-16 MED ORDER — OXYCODONE-ACETAMINOPHEN 5-325 MG PO TABS: 1.0000 | ORAL_TABLET | Freq: Three times a day (TID) | ORAL | 0 refills | Status: DC | PRN

## 2022-05-16 MED ORDER — OXYCODONE-ACETAMINOPHEN 5-325 MG PO TABS
2.0000 | ORAL_TABLET | Freq: Once | ORAL | Status: AC
  Administered 2022-05-16: 2 via ORAL
  Filled 2022-05-16: qty 2

## 2022-05-16 NOTE — Discharge Instructions (Addendum)
You have a fracture in your shoulder joint from your fall. Use the sling anytime for comfort. Ensure you follow up with the orthopedic doctor below for definitive management. Use tylenol/ibuprofen as labeled for mild/moderate pain, if it is severe, please utilize the medications I have provided.

## 2022-05-16 NOTE — ED Triage Notes (Signed)
Pt c/o left shoulder pain from mechanical fall yesterday morning. Pt states she tried taking tylenol and ibuprofen with no relief.

## 2022-05-16 NOTE — ED Provider Notes (Signed)
[facility] Provider Note   CSN: [id number] Arrival date & time: 05/16/22  0050     History  Chief Complaint  Patient presents with   Shoulder Pain    Victoria Brandt is a 47 y.o. female.  47 year old female that presents the ER today secondary to left shoulder pain.  She tripped over a gas hose and hit her left shoulder on a cement island with pain to the same area that Tylenol and ibuprofen are not helping.  Did not hit her head or have any pain anywhere else.   Shoulder Pain      Home Medications Prior to Admission medications   Medication Sig Start Date End Date Taking? Authorizing Provider  oxyCODONE-acetaminophen (PERCOCET/ROXICET) 5-325 MG tablet Take 1 tablet by mouth every 8 (eight) hours as needed for severe pain. 05/16/22  Yes Caela Huot, Corene Cornea, MD  Acetaminophen (TYLENOL) 325 MG CAPS Take by mouth.    [provider]  ezetimibe (ZETIA) 10 MG tablet Take 10 mg by mouth daily.    [provider]  HYDROcodone-acetaminophen (NORCO/VICODIN) 5-325 MG per tablet Take 1 tablet by mouth every 6 (six) hours as needed for moderate pain. 06/10/14   Carole Civil, MD  ibuprofen (ADVIL) 800 MG tablet Take 1 tablet (800 mg total) by mouth 3 (three) times daily. 11/23/18   Antwon Rochin, Corene Cornea, MD  Ketorolac Tromethamine (SPRIX) 15.75 MG/SPRAY SOLN Place into the nose.    [provider]  losartan-hydrochlorothiazide (HYZAAR) 50-12.5 MG per tablet Take 1 tablet by mouth every evening.    [provider]  methocarbamol (ROBAXIN) 500 MG tablet Take 1 tablet (500 mg total) by mouth 2 (two) times daily. 11/23/18   Malikai Gut, Corene Cornea, MD  predniSONE (DELTASONE) 20 MG tablet 3 tabs po daily x 3 days, then 2 tabs x 3 days, then 1.5 tabs x 3 days, then 1 tab x 3 days, then 0.5 tabs x 3 days 11/23/18   Jameria Bradway, Corene Cornea, MD  SUMAtriptan (IMITREX) 100 MG tablet Take 100 mg by mouth every 2 (two) hours as needed for migraine or  headache. May repeat in 2 hours if headache persists or recurs.    [provider]      Allergies    Bee venom    Review of Systems   Review of Systems  Physical Exam Updated Vital Signs BP (!) 150/94   Pulse 73   Temp 98.1 F (36.7 C) (Oral)   Resp 17   Ht 5\' 4"  (1.626 m)   Wt 112.5 kg   SpO2 100%   BMI 42.57 kg/m  Physical Exam Vitals and nursing note reviewed.  Constitutional:      Appearance: She is well-developed.  HENT:     Head: Normocephalic and atraumatic.  Eyes:     Conjunctiva/sclera: Conjunctivae normal.     Pupils: Pupils are equal, round, and reactive to light.  Cardiovascular:     Rate and Rhythm: Normal rate and regular rhythm.  Pulmonary:     Effort: No respiratory distress.     Breath sounds: No stridor.  Abdominal:     General: Abdomen is flat. There is no distension.  Musculoskeletal:        General: Tenderness (To her left shoulder worse with range of motion.) present.     Cervical back: Normal range of motion.  Skin:    General: Skin is warm and dry.  Neurological:     General: No focal deficit present.  Mental Status: She is alert.     ED Results / Procedures / Treatments   Labs (all labs ordered are listed, but only abnormal results are displayed) Labs Reviewed - No data to display  EKG None  Radiology CT Shoulder Left Wo Contrast  Result Date: 05/16/2022 CLINICAL DATA:  Left shoulder pain EXAM: CT OF THE UPPER LEFT EXTREMITY WITHOUT CONTRAST TECHNIQUE: Multidetector CT imaging of the upper left extremity was performed according to the standard protocol. RADIATION DOSE REDUCTION: This exam was performed according to the departmental dose-optimization program which includes automated exposure control, adjustment of the mA and/or kV according to patient size and/or use of iterative reconstruction technique. COMPARISON:  None Available. FINDINGS: Bones/Joint/Cartilage There is a comminuted, mildly displaced acute fracture of  the a anteroinferior glenoid in keeping with a mildly displaced bony Bankart fracture. Fracture fragments measure 10 x 17 and 6 x 13 mm in dimension. No dislocation. No other fracture identified. Glenohumeral and acromioclavicular joint spaces are preserved. Ligaments Suboptimally assessed by CT. Muscles and Tendons Unremarkable Soft tissues Small left shoulder effusion is present. IMPRESSION: 1. Mildly displaced, comminuted acute bony Bankart fracture. Electronically Signed   By: Fidela Salisbury M.D.   On: 05/16/2022 02:53   DG Shoulder Left Portable  Result Date: 05/16/2022 CLINICAL DATA:  Fall, left shoulder pain EXAM: LEFT SHOULDER COMPARISON:  None Available. FINDINGS: There is a crescentic ossific density seen along the inferior aspect of the glenoid suspicious for age indeterminate bony Bankart fracture. No other fracture identified. No dislocation. Acromioclavicular joint space is preserved. Glenohumeral joint space is not well profiled. Limited evaluation of the left hemithorax is unremarkable. IMPRESSION: 1. Suspected age indeterminate bony Bankart fracture along the inferior aspect of the glenoid. This could be better assessed with CT or MRI examination. Electronically Signed   By: Fidela Salisbury M.D.   On: 05/16/2022 01:38    Procedures Procedures    Medications Ordered in ED Medications  oxyCODONE-acetaminophen (PERCOCET/ROXICET) 5-325 MG per tablet 2 tablet (2 tablets Oral Given 05/16/22 [id number])    ED Course/ Medical Decision Making/ A&P                             Medical Decision Making Amount and/or Complexity of Data Reviewed Radiology: ordered.  Risk Prescription drug management.   47 yo F here with fall and left shoulder injury. Xr w/ possible bankart lesion, no h/o dislocation or trauma otherwise, will ct to eval the same. Pain meds/sling provided.   CT confirms glenoid fracture. Sling placed. Pain meds given. Ortho FU recommended.   Final Clinical Impression(s) / ED  Diagnoses Final diagnoses:  Bankart lesion of left shoulder, initial encounter  Closed fracture of glenoid cavity and neck of left scapula, initial encounter    Rx / DC Orders ED Discharge Orders          Ordered    oxyCODONE-acetaminophen (PERCOCET/ROXICET) 5-325 MG tablet  Every 8 hours PRN        05/16/22 0301              Jaella Weinert, Corene Cornea, MD 05/16/22 [phone]

## 2022-05-17 MED FILL — Oxycodone w/ Acetaminophen Tab 5-325 MG: ORAL | Qty: 6 | Status: AC

## 2022-05-23 DIAGNOSIS — M25512 Pain in left shoulder: Secondary | ICD-10-CM | POA: Diagnosis not present

## 2022-05-23 DIAGNOSIS — S42142A Displaced fracture of glenoid cavity of scapula, left shoulder, initial encounter for closed fracture: Secondary | ICD-10-CM | POA: Diagnosis not present

## 2022-08-22 ENCOUNTER — Ambulatory Visit (INDEPENDENT_AMBULATORY_CARE_PROVIDER_SITE_OTHER): Payer: 59 | Admitting: Family Medicine

## 2022-08-22 ENCOUNTER — Encounter: Payer: Self-pay | Admitting: Family Medicine

## 2022-08-22 VITALS — BP 158/100 | HR 68 | Ht 63.0 in | Wt 271.0 lb

## 2022-08-22 DIAGNOSIS — R059 Cough, unspecified: Secondary | ICD-10-CM

## 2022-08-22 DIAGNOSIS — Z114 Encounter for screening for human immunodeficiency virus [HIV]: Secondary | ICD-10-CM | POA: Diagnosis not present

## 2022-08-22 DIAGNOSIS — Z13220 Encounter for screening for lipoid disorders: Secondary | ICD-10-CM

## 2022-08-22 DIAGNOSIS — J40 Bronchitis, not specified as acute or chronic: Secondary | ICD-10-CM

## 2022-08-22 DIAGNOSIS — E538 Deficiency of other specified B group vitamins: Secondary | ICD-10-CM

## 2022-08-22 DIAGNOSIS — Z1329 Encounter for screening for other suspected endocrine disorder: Secondary | ICD-10-CM | POA: Diagnosis not present

## 2022-08-22 DIAGNOSIS — Z1159 Encounter for screening for other viral diseases: Secondary | ICD-10-CM

## 2022-08-22 DIAGNOSIS — E559 Vitamin D deficiency, unspecified: Secondary | ICD-10-CM | POA: Diagnosis not present

## 2022-08-22 DIAGNOSIS — I10 Essential (primary) hypertension: Secondary | ICD-10-CM | POA: Diagnosis not present

## 2022-08-22 DIAGNOSIS — Z131 Encounter for screening for diabetes mellitus: Secondary | ICD-10-CM

## 2022-08-22 DIAGNOSIS — Z1211 Encounter for screening for malignant neoplasm of colon: Secondary | ICD-10-CM

## 2022-08-22 MED ORDER — BUDESONIDE-FORMOTEROL FUMARATE 80-4.5 MCG/ACT IN AERO: 2.0000 | INHALATION_SPRAY | Freq: Two times a day (BID) | RESPIRATORY_TRACT | 3 refills | Status: AC

## 2022-08-22 MED ORDER — ALBUTEROL SULFATE HFA 108 (90 BASE) MCG/ACT IN AERS
2.0000 | INHALATION_SPRAY | Freq: Four times a day (QID) | RESPIRATORY_TRACT | 2 refills | Status: DC | PRN
Start: 2022-08-22 — End: 2023-07-21

## 2022-08-22 MED ORDER — SUMATRIPTAN SUCCINATE 100 MG PO TABS: 100.0000 mg | ORAL_TABLET | Freq: Once | ORAL | 1 refills | Status: DC | PRN

## 2022-08-22 MED ORDER — LOSARTAN POTASSIUM-HCTZ 50-12.5 MG PO TABS: 1.0000 | ORAL_TABLET | Freq: Every evening | ORAL | 3 refills | Status: DC

## 2022-08-22 MED ORDER — BENZONATATE 100 MG PO CAPS: 100.0000 mg | ORAL_CAPSULE | Freq: Two times a day (BID) | ORAL | 0 refills | Status: DC | PRN

## 2022-08-22 MED ORDER — PREDNISONE 20 MG PO TABS
20.00 mg | ORAL_TABLET | Freq: Two times a day (BID) | ORAL | 0 refills | Status: AC
Start: 2022-08-22 — End: 2022-08-27

## 2022-08-22 MED ORDER — ONDANSETRON HCL 4 MG PO TABS: 4.0000 mg | ORAL_TABLET | Freq: Three times a day (TID) | ORAL | 0 refills | Status: DC | PRN

## 2022-08-22 NOTE — Progress Notes (Unsigned)
New Patient Office Visit   Subjective   Patient ID: Victoria Brandt, female    DOB: 15-Sep-1975  Age: 47 y.o. MRN: [id number]  CC:  Chief Complaint  Patient presents with   Establish Care    Patient is here to establish care. Wants to discuss medication refills. Cough for 3 weeks, bilateral intermittent feet swelling.     HPI Victoria Brandt 47 year old presents to establish care. She  has a past medical history of Headache, Hypertension, and Migraines.  Hypertension This is a chronic problem. The problem has been waxing and waning since onset. The problem is uncontrolled. Associated symptoms include peripheral edema and shortness of breath. Pertinent negatives include no blurred vision, chest pain or headaches. Risk factors for coronary artery disease include dyslipidemia and obesity. Past treatments include angiotensin blockers and diuretics. The current treatment provides moderate improvement.  Cough This is a recurrent problem. The problem has been gradually worsening. The problem occurs constantly. The cough is Productive of sputum (green and yellow). Associated symptoms include shortness of breath. Pertinent negatives include no chest pain, chills, fever, headaches or myalgias. The symptoms are aggravated by exercise, lying down and dust. She has tried OTC cough suppressant for the symptoms. The treatment provided no relief. Her past medical history is significant for environmental allergies.      Outpatient Encounter Medications as of 08/22/2022  Medication Sig   albuterol (VENTOLIN HFA) 108 (90 Base) MCG/ACT inhaler Inhale 2 puffs into the lungs every 6 (six) hours as needed for wheezing or shortness of breath.   benzonatate (TESSALON) 100 MG capsule Take 1 capsule (100 mg total) by mouth 2 (two) times daily as needed for cough.   budesonide-formoterol (SYMBICORT) 80-4.5 MCG/ACT inhaler Inhale 2 puffs into the lungs 2 (two) times daily.   Cholecalciferol 1.25 MG (50000 UT)  capsule    EPINEPHrine 0.3 mg/0.3 mL IJ SOAJ injection    HYDROcodone-acetaminophen (NORCO/VICODIN) 5-325 MG per tablet Take 1 tablet by mouth every 6 (six) hours as needed for moderate pain.   ibuprofen (ADVIL) 800 MG tablet Take 1 tablet (800 mg total) by mouth 3 (three) times daily.   methocarbamol (ROBAXIN) 500 MG tablet Take 1 tablet (500 mg total) by mouth 2 (two) times daily.   ondansetron (ZOFRAN) 4 MG tablet Take 1 tablet (4 mg total) by mouth every 8 (eight) hours as needed for nausea or vomiting.   oxyCODONE-acetaminophen (PERCOCET/ROXICET) 5-325 MG tablet Take 1 tablet by mouth every 8 (eight) hours as needed for severe pain.   predniSONE (DELTASONE) 20 MG tablet Take 1 tablet (20 mg total) by mouth 2 (two) times daily with a meal for 5 days.   traMADol (ULTRAM) 50 MG tablet Take 50 mg by mouth.   [DISCONTINUED] losartan-hydrochlorothiazide (HYZAAR) 50-12.5 MG per tablet Take 1 tablet by mouth every evening. Patient reports being on 100-25mg    [DISCONTINUED] SUMAtriptan (IMITREX) 100 MG tablet Take 100 mg by mouth every 2 (two) hours as needed for migraine or headache. May repeat in 2 hours if headache persists or recurs.   losartan-hydrochlorothiazide (HYZAAR) 50-12.5 MG tablet Take 1 tablet by mouth every evening. Patient reports being on 100-25mg    SUMAtriptan (IMITREX) 100 MG tablet Take 1 tablet (100 mg total) by mouth once as needed for up to 1 dose for migraine or headache. May repeat in 2 hours if headache persists or recurs.   [DISCONTINUED] Acetaminophen (TYLENOL) 325 MG CAPS Take by mouth.   [DISCONTINUED] ezetimibe (ZETIA) 10 MG  tablet Take 10 mg by mouth daily.   [DISCONTINUED] Ketorolac Tromethamine (SPRIX) 15.75 MG/SPRAY SOLN Place into the nose.   [DISCONTINUED] predniSONE (DELTASONE) 20 MG tablet 3 tabs po daily x 3 days, then 2 tabs x 3 days, then 1.5 tabs x 3 days, then 1 tab x 3 days, then 0.5 tabs x 3 days   No facility-administered encounter medications on file as  of 08/22/2022.    Past Surgical History:  Procedure Laterality Date   ABDOMINAL HYSTERECTOMY     TUBAL LIGATION      Review of Systems  Constitutional:  Negative for chills and fever.  Eyes:  Negative for blurred vision.  Respiratory:  Positive for cough and shortness of breath.   Cardiovascular:  Negative for chest pain.  Gastrointestinal:  Negative for abdominal pain.  Genitourinary:  Negative for dysuria.  Musculoskeletal:  Negative for myalgias.  Neurological:  Negative for dizziness and headaches.  Endo/Heme/Allergies:  Positive for environmental allergies.      Objective    BP (!) 158/100   Pulse 68   Ht 5\' 3"  (1.6 m)   Wt 271 lb (122.9 kg)   SpO2 98%   BMI 48.01 kg/m   Physical Exam Vitals reviewed.  Constitutional:      General: She is not in acute distress.    Appearance: Normal appearance. She is not ill-appearing, toxic-appearing or diaphoretic.  HENT:     Head: Normocephalic.  Eyes:     General:        Right eye: No discharge.        Left eye: No discharge.     Conjunctiva/sclera: Conjunctivae normal.  Cardiovascular:     Rate and Rhythm: Normal rate.     Pulses: Normal pulses.     Heart sounds: Normal heart sounds.  Pulmonary:     Breath sounds: Rhonchi present.  Musculoskeletal:        General: Normal range of motion.     Cervical back: Normal range of motion.  Skin:    General: Skin is warm and dry.     Capillary Refill: Capillary refill takes less than 2 seconds.  Neurological:     General: No focal deficit present.     Mental Status: She is alert and oriented to person, place, and time.     Coordination: Coordination normal.     Gait: Gait normal.  Psychiatric:        Mood and Affect: Mood normal.        Behavior: Behavior normal.       Assessment & Plan:  Primary hypertension Assessment & Plan: Vitals:   08/22/22 1542 08/22/22 1552  BP: (!) 152/98 (!) 158/100  Blood pressure not controlled in today's visit due to not having her  medications refilled. Refilled Losartan-hydrochlorothiazide 50-25 mg Explained non pharmacological interventions such as low salt, DASH diet discussed. Educated on stress reduction and physical activity minimum 150 minutes per week. Discussed signs and symptoms of major cardiovascular event and need to present to the ED. Follow up in 6 weeks.Patient verbalizes understanding regarding plan of care and all questions answered.   Orders: -     CBC with Differential/Platelet -     CMP14+EGFR -     Microalbumin / creatinine urine ratio  Screening for lipid disorders -     Lipid panel  Screening for diabetes mellitus -     Hemoglobin A1c  Screening for thyroid disorder -     TSH + free T4  Screening  for HIV (human immunodeficiency virus) -     HIV Antibody (routine testing w rflx)  Screening for colon cancer -     Ambulatory referral to Gastroenterology  Need for hepatitis C screening test -     Hepatitis C antibody -     Hepatitis C antibody  Cough, unspecified type Assessment & Plan: Prednisone 20 mg x 5 days Benzonatate 100 mg PRN Follow up for worsening symptoms    Orders: -     Benzonatate; Take 1 capsule (100 mg total) by mouth 2 (two) times daily as needed for cough.  Dispense: 20 capsule; Refill: 0  Vitamin D deficiency -     VITAMIN D 25 Hydroxy (Vit-D Deficiency, Fractures)  Vitamin B12 deficiency -     Vitamin B12  Bronchitis Assessment & Plan: Prednisone 20 mg x 5 days Benzonatate 100 mg PRN Discussed symptomatic treatment, rest, increase oral fluid intake. Follow-up for worsening or persistent symptoms. Patient verbalizes understanding regarding plan of care and all questions answered    Orders: -     Albuterol Sulfate HFA; Inhale 2 puffs into the lungs every 6 (six) hours as needed for wheezing or shortness of breath.  Dispense: 8 g; Refill: 2 -     Budesonide-Formoterol Fumarate; Inhale 2 puffs into the lungs 2 (two) times daily.  Dispense: 1 each;  Refill: 3 -     predniSONE; Take 1 tablet (20 mg total) by mouth 2 (two) times daily with a meal for 5 days.  Dispense: 10 tablet; Refill: 0  Other orders -     Losartan Potassium-HCTZ; Take 1 tablet by mouth every evening. Patient reports being on 100-25mg   Dispense: 30 tablet; Refill: 3 -     SUMAtriptan Succinate; Take 1 tablet (100 mg total) by mouth once as needed for up to 1 dose for migraine or headache. May repeat in 2 hours if headache persists or recurs.  Dispense: 10 tablet; Refill: 1 -     Ondansetron HCl; Take 1 tablet (4 mg total) by mouth every 8 (eight) hours as needed for nausea or vomiting.  Dispense: 20 tablet; Refill: 0    Return in about 1 month (around 09/22/2022), or if symptoms worsen or fail to improve, for Pap smear.   Cruzita Lederer Newman Nip, FNP

## 2022-08-22 NOTE — Patient Instructions (Signed)

## 2022-08-22 NOTE — Assessment & Plan Note (Signed)
Vitals:   08/22/22 1542 08/22/22 1552  BP: (!) 152/98 (!) 158/100  Blood pressure not controlled in today's visit Refilled Losartan-hydrochlorothiazide 50-25 mg Explained non pharmacological interventions such as low salt, DASH diet discussed. Educated on stress reduction and physical activity minimum 150 minutes per week. Discussed signs and symptoms of major cardiovascular event and need to present to the ED. Follow up in 6 weeks.Patient verbalizes understanding regarding plan of care and all questions answered.

## 2022-08-23 ENCOUNTER — Encounter: Payer: Self-pay | Admitting: *Deleted

## 2022-08-23 LAB — CBC WITH DIFFERENTIAL/PLATELET
Basophils Absolute: 0 10*3/uL (ref 0.0–0.2)
Basos: 0 %
Eos: 5 %
Hematocrit: 40.4 % (ref 34.0–46.6)
Hemoglobin: 13.8 g/dL (ref 11.1–15.9)
Lymphocytes Absolute: 3.5 10*3/uL — ABNORMAL HIGH (ref 0.7–3.1)
Lymphs: 38 %
MCHC: 34.2 g/dL (ref 31.5–35.7)
Monocytes Absolute: 0.6 10*3/uL (ref 0.1–0.9)
Neutrophils: 50 %
Platelets: 280 10*3/uL (ref 150–450)
RBC: 5.05 x10E6/uL (ref 3.77–5.28)
RDW: 13.4 % (ref 11.7–15.4)
WBC: 9.2 10*3/uL (ref 3.4–10.8)

## 2022-08-23 LAB — CMP14+EGFR
AST: 19 IU/L (ref 0–40)
Alkaline Phosphatase: 94 IU/L (ref 44–121)
BUN/Creatinine Ratio: 4 — ABNORMAL LOW (ref 9–23)
BUN: 4 mg/dL — ABNORMAL LOW (ref 6–24)
CO2: 23 mmol/L (ref 20–29)
Calcium: 9.3 mg/dL (ref 8.7–10.2)
Chloride: 103 mmol/L (ref 96–106)
Sodium: 140 mmol/L (ref 134–144)
eGFR: 79 mL/min/{1.73_m2} (ref >59)

## 2022-08-23 LAB — LIPID PANEL
Cholesterol, Total: 197 mg/dL (ref 100–199)
HDL: 45 mg/dL (ref >39)
Triglycerides: 131 mg/dL (ref 0–149)
VLDL Cholesterol Cal: 24 mg/dL (ref 5–40)

## 2022-08-23 LAB — HEMOGLOBIN A1C
Est. average glucose Bld gHb Est-mCnc: 123 mg/dL
Hgb A1c MFr Bld: 5.9 % — ABNORMAL HIGH (ref 4.8–5.6)

## 2022-08-23 LAB — MICROALBUMIN / CREATININE URINE RATIO

## 2022-08-23 LAB — HEPATITIS C ANTIBODY: Hep C Virus Ab: NONREACTIVE

## 2022-08-24 DIAGNOSIS — J40 Bronchitis, not specified as acute or chronic: Secondary | ICD-10-CM | POA: Insufficient documentation

## 2022-08-24 DIAGNOSIS — J42 Unspecified chronic bronchitis: Secondary | ICD-10-CM | POA: Insufficient documentation

## 2022-08-24 DIAGNOSIS — R059 Cough, unspecified: Secondary | ICD-10-CM | POA: Insufficient documentation

## 2022-08-24 LAB — CBC WITH DIFFERENTIAL/PLATELET
EOS (ABSOLUTE): 0.4 10*3/uL (ref 0.0–0.4)
Immature Grans (Abs): 0 10*3/uL (ref 0.0–0.1)
Immature Granulocytes: 0 %
MCH: 27.3 pg (ref 26.6–33.0)
MCV: 80 fL (ref 79–97)
Monocytes: 7 %
Neutrophils Absolute: 4.6 10*3/uL (ref 1.4–7.0)

## 2022-08-24 LAB — CMP14+EGFR
ALT: 17 IU/L (ref 0–32)
Albumin: 4.3 g/dL (ref 3.9–4.9)
Bilirubin Total: 0.3 mg/dL (ref 0.0–1.2)
Creatinine, Ser: 0.9 mg/dL (ref 0.57–1.00)
Globulin, Total: 2.8 g/dL (ref 1.5–4.5)
Glucose: 86 mg/dL (ref 70–99)
Potassium: 4.1 mmol/L (ref 3.5–5.2)
Total Protein: 7.1 g/dL (ref 6.0–8.5)

## 2022-08-24 LAB — LIPID PANEL
Chol/HDL Ratio: 4.4 ratio (ref 0.0–4.4)
LDL Chol Calc (NIH): 128 mg/dL — ABNORMAL HIGH (ref 0–99)

## 2022-08-24 LAB — TSH+FREE T4
Free T4: 1.15 ng/dL (ref 0.82–1.77)
TSH: 1.32 u[IU]/mL (ref 0.450–4.500)

## 2022-08-24 LAB — VITAMIN D 25 HYDROXY (VIT D DEFICIENCY, FRACTURES): Vit D, 25-Hydroxy: 34.5 ng/mL (ref 30.0–100.0)

## 2022-08-24 LAB — VITAMIN B12: Vitamin B-12: 262 pg/mL (ref 232–1245)

## 2022-08-24 LAB — HIV ANTIBODY (ROUTINE TESTING W REFLEX): HIV Screen 4th Generation wRfx: NONREACTIVE

## 2022-08-24 NOTE — Assessment & Plan Note (Addendum)
Prednisone 20 mg x 5 days Benzonatate 100 mg PRN Follow up for worsening symptoms

## 2022-08-24 NOTE — Assessment & Plan Note (Addendum)
Prednisone 20 mg x 5 days Benzonatate 100 mg PRN Discussed symptomatic treatment, rest, increase oral fluid intake. Follow-up for worsening or persistent symptoms. Patient verbalizes understanding regarding plan of care and all questions answered

## 2022-08-25 ENCOUNTER — Encounter: Payer: Self-pay | Admitting: Family Medicine

## 2022-08-25 ENCOUNTER — Other Ambulatory Visit: Payer: Self-pay | Admitting: Family Medicine

## 2022-08-25 MED ORDER — ATORVASTATIN CALCIUM 20 MG PO TABS
20.0000 mg | ORAL_TABLET | Freq: Every day | ORAL | 3 refills | Status: DC
Start: 2022-08-25 — End: 2023-07-21

## 2022-09-28 ENCOUNTER — Other Ambulatory Visit (HOSPITAL_COMMUNITY)
Admission: RE | Admit: 2022-09-28 | Discharge: 2022-09-28 | Disposition: A | Discharge status: Home / Self Care | Payer: 59 | Source: Ambulatory Visit | Attending: Family Medicine | Admitting: Family Medicine

## 2022-09-28 ENCOUNTER — Ambulatory Visit: Payer: 59 | Admitting: Family Medicine

## 2022-09-28 ENCOUNTER — Ambulatory Visit (INDEPENDENT_AMBULATORY_CARE_PROVIDER_SITE_OTHER): Payer: 59 | Admitting: Family Medicine

## 2022-09-28 ENCOUNTER — Encounter: Payer: Self-pay | Admitting: Family Medicine

## 2022-09-28 VITALS — BP 124/88 | HR 68 | Ht 63.0 in | Wt 271.0 lb

## 2022-09-28 DIAGNOSIS — Z124 Encounter for screening for malignant neoplasm of cervix: Secondary | ICD-10-CM

## 2022-09-28 DIAGNOSIS — Z01419 Encounter for gynecological examination (general) (routine) without abnormal findings: Secondary | ICD-10-CM | POA: Diagnosis not present

## 2022-09-28 NOTE — Patient Instructions (Signed)

## 2022-09-28 NOTE — Progress Notes (Signed)
Patient Office Visit   Subjective   Patient ID: Victoria Brandt, female    DOB: [birth year]  Age: 47 y.o. MRN: [id number]  CC:  Chief Complaint  Patient presents with   Annual Exam    HPI Victoria Brandt 47 year old female, presents to the clinic for cervical cancer screening. She  has a past medical history of Headache, Hypertension, and Migraines.For the details of today's visit, please refer to assessment and plan.   HPI    Outpatient Encounter Medications as of 09/28/2022  Medication Sig   albuterol (VENTOLIN HFA) 108 (90 Base) MCG/ACT inhaler Inhale 2 puffs into the lungs every 6 (six) hours as needed for wheezing or shortness of breath.   atorvastatin (LIPITOR) 20 MG tablet Take 1 tablet (20 mg total) by mouth daily.   benzonatate (TESSALON) 100 MG capsule Take 1 capsule (100 mg total) by mouth 2 (two) times daily as needed for cough.   budesonide-formoterol (SYMBICORT) 80-4.5 MCG/ACT inhaler Inhale 2 puffs into the lungs 2 (two) times daily.   Cholecalciferol 1.25 MG (50000 UT) capsule    EPINEPHrine 0.3 mg/0.3 mL IJ SOAJ injection    HYDROcodone-acetaminophen (NORCO/VICODIN) 5-325 MG per tablet Take 1 tablet by mouth every 6 (six) hours as needed for moderate pain.   ibuprofen (ADVIL) 800 MG tablet Take 1 tablet (800 mg total) by mouth 3 (three) times daily.   losartan-hydrochlorothiazide (HYZAAR) 50-12.5 MG tablet Take 1 tablet by mouth every evening. Patient reports being on 100-25mg    methocarbamol (ROBAXIN) 500 MG tablet Take 1 tablet (500 mg total) by mouth 2 (two) times daily.   ondansetron (ZOFRAN) 4 MG tablet Take 1 tablet (4 mg total) by mouth every 8 (eight) hours as needed for nausea or vomiting.   oxyCODONE-acetaminophen (PERCOCET/ROXICET) 5-325 MG tablet Take 1 tablet by mouth every 8 (eight) hours as needed for severe pain.   SUMAtriptan (IMITREX) 100 MG tablet Take 1 tablet (100 mg total) by mouth once as needed for up to 1 dose for migraine or headache.  May repeat in 2 hours if headache persists or recurs.   traMADol (ULTRAM) 50 MG tablet Take 50 mg by mouth.   No facility-administered encounter medications on file as of 09/28/2022.    Past Surgical History:  Procedure Laterality Date   ABDOMINAL HYSTERECTOMY     TUBAL LIGATION      Review of Systems  Constitutional:  Negative for chills and fever.  Respiratory:  Negative for shortness of breath.   Cardiovascular:  Negative for chest pain.  Genitourinary:  Negative for dysuria, flank pain, frequency, hematuria and urgency.      Objective    BP 124/88   Pulse 68   Ht 5\' 3"  (1.6 m)   Wt 271 lb (122.9 kg)   BMI 48.01 kg/m   Physical Exam Vitals reviewed.  Constitutional:      General: She is not in acute distress.    Appearance: Normal appearance. She is not ill-appearing, toxic-appearing or diaphoretic.  HENT:     Head: Normocephalic.  Eyes:     General:        Right eye: No discharge.        Left eye: No discharge.     Conjunctiva/sclera: Conjunctivae normal.  Cardiovascular:     Rate and Rhythm: Normal rate.     Pulses: Normal pulses.  Pulmonary:     Effort: Pulmonary effort is normal. No respiratory distress.     Breath sounds: Normal breath  sounds.  Genitourinary:    General: Normal vulva.     Exam position: Lithotomy position.     Pubic Area: No rash.      Labia:        Right: No rash.        Left: No rash.      Vagina: Normal. No vaginal discharge.     Cervix: Normal.     Rectum: Normal.  Musculoskeletal:     Cervical back: Normal range of motion.  Skin:    General: Skin is warm and dry.     Capillary Refill: Capillary refill takes less than 2 seconds.  Neurological:     Mental Status: She is alert.     Coordination: Coordination normal.     Gait: Gait normal.  Psychiatric:        Mood and Affect: Mood normal.        Behavior: Behavior normal.       Assessment & Plan:  Cervical cancer screening Assessment & Plan: Pap smear done, Patient  tolerated procedure well. Informed patient I will keep them updated on results. Right breast normal without mass, skin or nipple changes or axillary nodes, left breast normal without mass, skin or nipple changes or axillary nodes.  Orders: -     Cytology - PAP    Return in about 3 months (around 12/29/2022), or if symptoms worsen or fail to improve, for chronic follow-up.   Cruzita Lederer Newman Nip, FNP

## 2022-09-28 NOTE — Assessment & Plan Note (Signed)
Pap smear done, Patient tolerated procedure well. Informed patient I will keep them updated on results. Right breast normal without mass, skin or nipple changes or axillary nodes, left breast normal without mass, skin or nipple changes or axillary nodes. 

## 2022-09-30 LAB — CYTOLOGY - PAP
Adequacy: ABSENT
Diagnosis: NEGATIVE

## 2022-10-05 ENCOUNTER — Telehealth: Payer: Self-pay | Admitting: Internal Medicine

## 2022-10-05 NOTE — Addendum Note (Signed)
Addended by: Armstead Peaks on: 10/05/2022 02:15 PM   Modules accepted: Orders

## 2022-10-05 NOTE — Telephone Encounter (Signed)
Questionnaire is in review.

## 2022-10-05 NOTE — Telephone Encounter (Signed)
  Procedure: Colonoscopy  Height: 5'3 Weight: 269lbs        Have you had a colonoscopy before?  no  Do you have family history of colon cancer?  no  Do you have a family history of polyps? no  Previous colonoscopy with polyps removed? no  Do you have a history colorectal cancer?   no  Are you diabetic?  no  Do you have a prosthetic or mechanical heart valve? no  Do you have a pacemaker/defibrillator?   no  Have you had endocarditis/atrial fibrillation?  no  Do you use supplemental oxygen/CPAP?  no  Have you had joint replacement within the last 12 months?  no  Do you tend to be constipated or have to use laxatives?  no   Do you have history of alcohol use? If yes, how much and how often.  no  Do you have history or are you using drugs? If yes, what do are you  using?  no  Have you ever had a stroke/heart attack?  no  Have you ever had a heart or other vascular stent placed,?no  Do you take weight loss medication? no  female patients,: have you had a hysterectomy? no                              are you post menopausal?                                do you still have your menstrual cycle? no    Date of last menstrual period? 2008  Do you take any blood-thinning medications such as: (Plavix, aspirin, Coumadin, Aggrenox, Brilinta, Xarelto, Eliquis, Pradaxa, Savaysa or Effient)? no  If yes we need the name, milligram, dosage and who is prescribing doctor:               Current Outpatient Medications  Medication Sig Dispense Refill   albuterol (VENTOLIN HFA) 108 (90 Base) MCG/ACT inhaler Inhale 2 puffs into the lungs every 6 (six) hours as needed for wheezing or shortness of breath. 8 g 2   atorvastatin (LIPITOR) 20 MG tablet Take 1 tablet (20 mg total) by mouth daily. 90 tablet 3   budesonide-formoterol (SYMBICORT) 80-4.5 MCG/ACT inhaler Inhale 2 puffs into the lungs 2 (two) times daily. 1 each 3   losartan-hydrochlorothiazide (HYZAAR) 50-12.5 MG tablet Take 1 tablet  by mouth every evening. Patient reports being on 100-25mg  30 tablet 3   ondansetron (ZOFRAN) 4 MG tablet Take 1 tablet (4 mg total) by mouth every 8 (eight) hours as needed for nausea or vomiting. 20 tablet 0   SUMAtriptan (IMITREX) 100 MG tablet Take 1 tablet (100 mg total) by mouth once as needed for up to 1 dose for migraine or headache. May repeat in 2 hours if headache persists or recurs. 10 tablet 1   No current facility-administered medications for this visit.    Allergies  Allergen Reactions   Bee Venom Shortness Of Breath and Swelling

## 2022-10-14 ENCOUNTER — Encounter: Payer: Self-pay | Admitting: Family Medicine

## 2022-10-14 ENCOUNTER — Ambulatory Visit (INDEPENDENT_AMBULATORY_CARE_PROVIDER_SITE_OTHER): Payer: 59 | Admitting: Family Medicine

## 2022-10-14 VITALS — BP 134/84 | HR 65 | Resp 16 | Ht 63.0 in | Wt 268.1 lb

## 2022-10-14 DIAGNOSIS — Z23 Encounter for immunization: Secondary | ICD-10-CM

## 2022-10-14 DIAGNOSIS — J410 Simple chronic bronchitis: Secondary | ICD-10-CM

## 2022-10-14 MED ORDER — BENZONATATE 200 MG PO CAPS: 200.0000 mg | ORAL_CAPSULE | Freq: Two times a day (BID) | ORAL | 1 refills | Status: DC | PRN

## 2022-10-14 MED ORDER — MONTELUKAST SODIUM 10 MG PO TABS: 10.0000 mg | ORAL_TABLET | Freq: Every day | ORAL | 3 refills | Status: DC

## 2022-10-14 MED ORDER — PREDNISONE 20 MG PO TABS: 20.0000 mg | ORAL_TABLET | Freq: Two times a day (BID) | ORAL | 0 refills | Status: AC

## 2022-10-14 NOTE — Patient Instructions (Signed)
        Great to see you today.   - Please take medications as prescribed. - Follow up with your primary health provider if any health concerns arises. - If symptoms worsen please contact your primary care provider and/or visit the emergency department.  

## 2022-10-14 NOTE — Progress Notes (Unsigned)
Patient Office Visit   Subjective   Patient ID: Victoria Brandt, female    DOB: 20-Nov-1975  Age: 47 y.o. MRN: [id number]  CC:  Chief Complaint  Patient presents with   Cough    Off and on lingering cough, sometimes produces clear/white phlegm    HPI Victoria Brandt 47 year old female, presents to the clinic for worsening cough. She  has a past medical history of Headache, Hyperlipidemia, Hypertension, and Migraines.  Cough  This is a recurrent problem. The problem has been waxing and waning. Cough occurs constantly. The cough is Productive of purulent sputum. Associated symptoms include nasal congestion and postnasal drip. Pertinent negatives include no fever, sore throat or shortness of breath. The symptoms are aggravated by pollens and lying down weather changes. She has tried a beta-agonist inhaler for the symptoms. The treatment provided no relief. Her past medical history is significant for asthma and bronchitis.        Outpatient Encounter Medications as of 10/14/2022  Medication Sig   albuterol (VENTOLIN HFA) 108 (90 Base) MCG/ACT inhaler Inhale 2 puffs into the lungs every 6 (six) hours as needed for wheezing or shortness of breath.   atorvastatin (LIPITOR) 20 MG tablet Take 1 tablet (20 mg total) by mouth daily.   benzonatate (TESSALON) 200 MG capsule Take 1 capsule (200 mg total) by mouth 2 (two) times daily as needed for cough.   budesonide-formoterol (SYMBICORT) 80-4.5 MCG/ACT inhaler Inhale 2 puffs into the lungs 2 (two) times daily.   losartan-hydrochlorothiazide (HYZAAR) 50-12.5 MG tablet Take 1 tablet by mouth every evening. Patient reports being on 100-25mg    montelukast (SINGULAIR) 10 MG tablet Take 1 tablet (10 mg total) by mouth at bedtime.   ondansetron (ZOFRAN) 4 MG tablet Take 1 tablet (4 mg total) by mouth every 8 (eight) hours as needed for nausea or vomiting.   predniSONE (DELTASONE) 20 MG tablet Take 1 tablet (20 mg total) by mouth 2 (two) times daily  with a meal for 5 days.   SUMAtriptan (IMITREX) 100 MG tablet Take 1 tablet (100 mg total) by mouth once as needed for up to 1 dose for migraine or headache. May repeat in 2 hours if headache persists or recurs.   No facility-administered encounter medications on file as of 10/14/2022.    Past Surgical History:  Procedure Laterality Date   ABDOMINAL HYSTERECTOMY     TUBAL LIGATION      Review of Systems  Constitutional:  Negative for chills and fever.  Respiratory:  Positive for cough and sputum production.   Cardiovascular:  Negative for chest pain.  Neurological:  Negative for dizziness and headaches.      Objective    BP 134/84   Pulse 65   Resp 16   Ht 5\' 3"  (1.6 m)   Wt 268 lb 1.9 oz (121.6 kg)   SpO2 95%   BMI 47.50 kg/m   Physical Exam Vitals reviewed.  Constitutional:      General: She is not in acute distress.    Appearance: Normal appearance. She is not ill-appearing, toxic-appearing or diaphoretic.  HENT:     Head: Normocephalic.  Eyes:     General:        Right eye: No discharge.        Left eye: No discharge.     Conjunctiva/sclera: Conjunctivae normal.  Cardiovascular:     Rate and Rhythm: Normal rate.     Pulses: Normal pulses.     Heart sounds:  Normal heart sounds.  Pulmonary:     Effort: Pulmonary effort is normal. No respiratory distress.     Breath sounds: Rhonchi present.  Abdominal:     Tenderness: There is no abdominal tenderness.  Musculoskeletal:        General: Normal range of motion.  Skin:    General: Skin is warm and dry.     Capillary Refill: Capillary refill takes less than 2 seconds.  Neurological:     General: No focal deficit present.     Mental Status: She is alert and oriented to person, place, and time.     Coordination: Coordination normal.     Gait: Gait normal.  Psychiatric:        Mood and Affect: Mood normal.        Behavior: Behavior normal.       Assessment & Plan:  Encounter for immunization -     Flu  vaccine trivalent PF, 6mos and older(Flulaval,Afluria,Fluarix,Fluzone)  Simple chronic bronchitis (HCC) Assessment & Plan: Trial on Montelukast 10 mg at bedtime Benzonatate 200 mg PRN Short course treatment of Prednisone 20 mg twice daily x 5 days Advise Symptomatic treatment, rest, increase oral fluid intake.  Follow-up for worsening or persistent symptoms. Patient verbalizes understanding regarding plan of care and all questions answered    Other orders -     Benzonatate; Take 1 capsule (200 mg total) by mouth 2 (two) times daily as needed for cough.  Dispense: 20 capsule; Refill: 1 -     Montelukast Sodium; Take 1 tablet (10 mg total) by mouth at bedtime.  Dispense: 30 tablet; Refill: 3 -     predniSONE; Take 1 tablet (20 mg total) by mouth 2 (two) times daily with a meal for 5 days.  Dispense: 10 tablet; Refill: 0    Return if symptoms worsen or fail to improve.   Cruzita Lederer Newman Nip, FNP

## 2022-10-15 ENCOUNTER — Encounter: Payer: Self-pay | Admitting: Family Medicine

## 2022-10-15 NOTE — Assessment & Plan Note (Addendum)
Trial on Montelukast 10 mg at bedtime Benzonatate 200 mg PRN Short course treatment of Prednisone 20 mg twice daily x 5 days Advise Symptomatic treatment, rest, increase oral fluid intake.  Follow-up for worsening or persistent symptoms. Patient verbalizes understanding regarding plan of care and all questions answered

## 2022-10-26 ENCOUNTER — Other Ambulatory Visit (HOSPITAL_COMMUNITY): Payer: Self-pay | Admitting: Family Medicine

## 2022-10-26 DIAGNOSIS — Z1231 Encounter for screening mammogram for malignant neoplasm of breast: Secondary | ICD-10-CM

## 2022-10-28 ENCOUNTER — Encounter (HOSPITAL_COMMUNITY): Payer: Self-pay

## 2022-10-28 ENCOUNTER — Ambulatory Visit (HOSPITAL_COMMUNITY)
Admission: RE | Admit: 2022-10-28 | Discharge: 2022-10-28 | Disposition: A | Discharge status: Home / Self Care | Payer: 59 | Source: Ambulatory Visit | Attending: Family Medicine | Admitting: Family Medicine

## 2022-10-28 DIAGNOSIS — Z1231 Encounter for screening mammogram for malignant neoplasm of breast: Secondary | ICD-10-CM | POA: Diagnosis not present

## 2022-11-15 NOTE — Telephone Encounter (Signed)
LMOVM to call back 

## 2022-11-15 NOTE — Telephone Encounter (Signed)
Okay to schedule ASA 3 due to chronic bronchitis and BMI

## 2022-11-30 NOTE — Telephone Encounter (Signed)
Spoke with pt. Gave dates of 11/15,11/25,12/2,12/10,12/16,12/20,12/23. 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 She will check to see which day is best and call back.

## 2022-12-03 DIAGNOSIS — E785 Hyperlipidemia, unspecified: Secondary | ICD-10-CM | POA: Diagnosis not present

## 2022-12-03 DIAGNOSIS — I10 Essential (primary) hypertension: Secondary | ICD-10-CM | POA: Diagnosis not present

## 2022-12-03 DIAGNOSIS — G43909 Migraine, unspecified, not intractable, without status migrainosus: Secondary | ICD-10-CM | POA: Diagnosis not present

## 2022-12-03 DIAGNOSIS — J45909 Unspecified asthma, uncomplicated: Secondary | ICD-10-CM | POA: Diagnosis not present

## 2022-12-03 DIAGNOSIS — Z6841 Body Mass Index (BMI) 40.0 and over, adult: Secondary | ICD-10-CM | POA: Diagnosis not present

## 2022-12-03 DIAGNOSIS — R112 Nausea with vomiting, unspecified: Secondary | ICD-10-CM | POA: Diagnosis not present

## 2022-12-03 DIAGNOSIS — Z7951 Long term (current) use of inhaled steroids: Secondary | ICD-10-CM | POA: Diagnosis not present

## 2022-12-03 DIAGNOSIS — Z91038 Other insect allergy status: Secondary | ICD-10-CM | POA: Diagnosis not present

## 2022-12-16 ENCOUNTER — Other Ambulatory Visit: Payer: Self-pay | Admitting: Family Medicine

## 2022-12-16 ENCOUNTER — Other Ambulatory Visit: Payer: Self-pay

## 2022-12-16 MED ORDER — LOSARTAN POTASSIUM-HCTZ 50-12.5 MG PO TABS: 1.0000 | ORAL_TABLET | Freq: Every evening | ORAL | 3 refills | Status: DC

## 2022-12-21 NOTE — Patient Instructions (Signed)

## 2022-12-21 NOTE — Progress Notes (Unsigned)
   Established Patient Office Visit   Subjective  Patient ID: Victoria Brandt, female    DOB: 03/24/1975  Age: 47 y.o. MRN: [id number]  No chief complaint on file.   She  has a past medical history of Headache, Hyperlipidemia, Hypertension, and Migraines.  HPI  ROS    Objective:     There were no vitals taken for this visit. {Vitals History (Optional):23777}  Physical Exam   No results found for any visits on 12/22/22.  The 10-year ASCVD risk score (Arnett DK, et al., 2019) is: 2%    Assessment & Plan:  There are no diagnoses linked to this encounter.  No follow-ups on file.   Cruzita Lederer Newman Nip, FNP

## 2022-12-22 ENCOUNTER — Ambulatory Visit (INDEPENDENT_AMBULATORY_CARE_PROVIDER_SITE_OTHER): Payer: 59 | Admitting: Family Medicine

## 2022-12-22 ENCOUNTER — Encounter: Payer: Self-pay | Admitting: Family Medicine

## 2022-12-22 VITALS — BP 166/84 | HR 86 | Ht 63.0 in | Wt 271.0 lb

## 2022-12-22 DIAGNOSIS — R7303 Prediabetes: Secondary | ICD-10-CM

## 2022-12-22 DIAGNOSIS — I10 Essential (primary) hypertension: Secondary | ICD-10-CM | POA: Diagnosis not present

## 2022-12-22 DIAGNOSIS — M25562 Pain in left knee: Secondary | ICD-10-CM | POA: Diagnosis not present

## 2022-12-22 MED ORDER — SUMATRIPTAN SUCCINATE 100 MG PO TABS: 100.0000 mg | ORAL_TABLET | Freq: Once | ORAL | 1 refills | Status: DC | PRN

## 2022-12-22 MED ORDER — BENZONATATE 200 MG PO CAPS: 200.0000 mg | ORAL_CAPSULE | Freq: Two times a day (BID) | ORAL | 1 refills | Status: DC | PRN

## 2022-12-22 MED ORDER — ONDANSETRON HCL 4 MG PO TABS: 4.0000 mg | ORAL_TABLET | Freq: Three times a day (TID) | ORAL | 0 refills | Status: DC | PRN

## 2022-12-22 MED ORDER — MELOXICAM 7.5 MG PO TABS: 7.5000 mg | ORAL_TABLET | Freq: Every day | ORAL | 1 refills | Status: DC

## 2022-12-22 MED ORDER — METHYLPREDNISOLONE ACETATE 40 MG/ML IJ SUSP: 40.0000 mg | Freq: Once | INTRAMUSCULAR | Status: DC

## 2022-12-22 MED ORDER — GABAPENTIN 100 MG PO CAPS: 100.0000 mg | ORAL_CAPSULE | Freq: Three times a day (TID) | ORAL | 3 refills | Status: DC

## 2022-12-22 NOTE — Assessment & Plan Note (Addendum)
Depo-Medrol IM 40 mg injection given today Mobic 7.5 mg PRN Xray ordered I explained to the patient that non-pharmacological interventions include the wearing a knee brace, application of ice or heat, rest, and recommended range of motion exercises along with gentle stretching. For pain management, Tylenol was advised. The patient was instructed to follow up if symptoms worsen or persist. The patient verbalized understanding of the care plan, and all questions were answered.

## 2022-12-23 ENCOUNTER — Ambulatory Visit (HOSPITAL_COMMUNITY)
Admission: RE | Admit: 2022-12-23 | Discharge: 2022-12-23 | Disposition: A | Discharge status: Home / Self Care | Payer: 59 | Source: Ambulatory Visit | Attending: Family Medicine | Admitting: Family Medicine

## 2022-12-23 DIAGNOSIS — M25462 Effusion, left knee: Secondary | ICD-10-CM | POA: Diagnosis not present

## 2022-12-23 DIAGNOSIS — M1712 Unilateral primary osteoarthritis, left knee: Secondary | ICD-10-CM | POA: Diagnosis not present

## 2022-12-23 DIAGNOSIS — M25562 Pain in left knee: Secondary | ICD-10-CM | POA: Insufficient documentation

## 2022-12-29 ENCOUNTER — Ambulatory Visit: Payer: 59 | Admitting: Family Medicine

## 2022-12-30 ENCOUNTER — Telehealth: Payer: Self-pay

## 2022-12-30 ENCOUNTER — Ambulatory Visit: Payer: Self-pay | Admitting: Family Medicine

## 2022-12-30 NOTE — Telephone Encounter (Signed)
Still pending

## 2023-01-02 ENCOUNTER — Encounter: Payer: Self-pay | Admitting: Family Medicine

## 2023-01-02 ENCOUNTER — Other Ambulatory Visit: Payer: Self-pay

## 2023-01-02 DIAGNOSIS — Z1211 Encounter for screening for malignant neoplasm of colon: Secondary | ICD-10-CM

## 2023-01-06 ENCOUNTER — Other Ambulatory Visit: Payer: Self-pay | Admitting: Family Medicine

## 2023-01-06 DIAGNOSIS — M25562 Pain in left knee: Secondary | ICD-10-CM

## 2023-01-06 NOTE — Progress Notes (Signed)
Please inform patient,   Referral placed to Orthopedics   Referral can be place to physical therapy if interested   Xray shows a small bone fragment near your kneecap, possibly from a tendon issue or a minor fracture. There is also mild fluid buildup in the knee (effusion) and early signs of arthritis.  Treatment Plan:  Pain Relief: Use NSAIDs and apply ice to reduce pain and swelling.  Rest and Support: Avoid high-impact activities; consider a knee brace for support.  Physical Therapy: Strengthen knee-supporting muscles with low-impact exercises.

## 2023-01-11 ENCOUNTER — Encounter: Payer: Self-pay | Admitting: Family Medicine

## 2023-01-16 ENCOUNTER — Other Ambulatory Visit: Payer: Self-pay | Admitting: Family Medicine

## 2023-01-16 DIAGNOSIS — M25562 Pain in left knee: Secondary | ICD-10-CM

## 2023-01-16 NOTE — Telephone Encounter (Signed)
sent 

## 2023-01-16 NOTE — Telephone Encounter (Signed)
Thank you :)

## 2023-01-18 DIAGNOSIS — Z1211 Encounter for screening for malignant neoplasm of colon: Secondary | ICD-10-CM | POA: Diagnosis not present

## 2023-01-24 ENCOUNTER — Ambulatory Visit: Payer: 59 | Admitting: Orthopedic Surgery

## 2023-01-26 LAB — COLOGUARD: COLOGUARD: NEGATIVE

## 2023-01-31 ENCOUNTER — Encounter: Payer: Self-pay | Admitting: Orthopedic Surgery

## 2023-01-31 ENCOUNTER — Ambulatory Visit: Payer: 59 | Admitting: Orthopedic Surgery

## 2023-01-31 VITALS — BP 141/85 | HR 75 | Ht 63.0 in | Wt 272.0 lb

## 2023-01-31 DIAGNOSIS — M25562 Pain in left knee: Secondary | ICD-10-CM

## 2023-01-31 NOTE — Patient Instructions (Addendum)
Instructions Following Joint Injections  In clinic today, you received an injection in one of your joints (sometimes more than one).  Occasionally, you can have some pain at the injection site, this is normal.  You can place ice at the injection site, or take over-the-counter medications such as Tylenol (acetaminophen) or Advil (ibuprofen).  Please follow all directions listed on the bottle.  If your joint (knee or shoulder) becomes swollen, red or very painful, please contact the clinic for additional assistance.   Two medications were injected, including lidocaine and a steroid (often referred to as cortisone).  Lidocaine is effective almost immediately but wears off quickly.  However, the steroid can take a few days to improve your symptoms.  In some cases, it can make your pain worse for a couple of days.  Do not be concerned if this happens as it is common.  You can apply ice or take some over-the-counter medications as needed.   Injections in the same joint cannot be repeated for 3 months.  This helps to limit the risk of an infection in the joint.  If you were to develop an infection in your joint, the best treatment option would be surgery.      Knee Exercises  Ask your health care provider which exercises are safe for you. Do exercises exactly as told by your health care provider and adjust them as directed. It is normal to feel mild stretching, pulling, tightness, or discomfort as you do these exercises. Stop right away if you feel sudden pain or your pain gets worse. Do not begin these exercises until told by your health care provider.  Stretching and range-of-motion exercises These exercises warm up your muscles and joints and improve the movement and flexibility of your knee. These exercises also help to relieve pain and swelling.  Knee extension, prone Lie on your abdomen (prone position) on a bed. Place your left / right knee just beyond the edge of the surface so your knee is  not on the bed. You can put a towel under your left / right thigh just above your kneecap for comfort. Relax your leg muscles and allow gravity to straighten your knee (extension). You should feel a stretch behind your left / right knee. Hold this position for 10 seconds. Scoot up so your knee is supported between repetitions. Repeat 10 times. Complete this exercise 3-4 times per week.     Knee flexion, active Lie on your back with both legs straight. If this causes back discomfort, bend your left / right knee so your foot is flat on the floor. Slowly slide your left / right heel back toward your buttocks. Stop when you feel a gentle stretch in the front of your knee or thigh (flexion). Hold this position for 10 seconds. Slowly slide your left / right heel back to the starting position. Repeat 10 times. Complete this exercise 3-4 times per week.      Quadriceps stretch, prone Lie on your abdomen on a firm surface, such as a bed or padded floor. Bend your left / right knee and hold your ankle. If you cannot reach your ankle or pant leg, loop a belt around your foot and grab the belt instead. Gently pull your heel toward your buttocks. Your knee should not slide out to the side. You should feel a stretch in the front of your thigh and knee (quadriceps). Hold this position for 10 seconds. Repeat 10 times. Complete this exercise 3-4 times per week.  Hamstring, supine Lie on your back (supine position). Loop a belt or towel over the ball of your left / right foot. The ball of your foot is on the walking surface, right under your toes. Straighten your left / right knee and slowly pull on the belt to raise your leg until you feel a gentle stretch behind your knee (hamstring). Do not let your knee bend while you do this. Keep your other leg flat on the floor. Hold this position for 10 seconds. Repeat 10 times. Complete this exercise 3-4 times per week.   Strengthening exercises These  exercises build strength and endurance in your knee. Endurance is the ability to use your muscles for a long time, even after they get tired.  Quadriceps, isometric This exercise stretches the muscles in front of your thigh (quadriceps) without moving your knee joint (isometric). Lie on your back with your left / right leg extended and your other knee bent. Put a rolled towel or small pillow under your knee if told by your health care provider. Slowly tense the muscles in the front of your left / right thigh. You should see your kneecap slide up toward your hip or see increased dimpling just above the knee. This motion will push the back of the knee toward the floor. For 10 seconds, hold the muscle as tight as you can without increasing your pain. Relax the muscles slowly and completely. Repeat 10 times. Complete this exercise 3-4 times per week. .     Straight leg raises This exercise stretches the muscles in front of your thigh (quadriceps) and the muscles that move your hips (hip flexors). Lie on your back with your left / right leg extended and your other knee bent. Tense the muscles in the front of your left / right thigh. You should see your kneecap slide up or see increased dimpling just above the knee. Your thigh may even shake a bit. Keep these muscles tight as you raise your leg 4-6 inches (10-15 cm) off the floor. Do not let your knee bend. Hold this position for 10 seconds. Keep these muscles tense as you lower your leg. Relax your muscles slowly and completely after each repetition. Repeat 10 times. Complete this exercise 3-4 times per week.  Hamstring, isometric Lie on your back on a firm surface. Bend your left / right knee about 30 degrees. Dig your left / right heel into the surface as if you are trying to pull it toward your buttocks. Tighten the muscles in the back of your thighs (hamstring) to "dig" as hard as you can without increasing any pain. Hold this position for 10  seconds. Release the tension gradually and allow your muscles to relax completely for __________ seconds after each repetition. Repeat 10 times. Complete this exercise 3-4 times per week.  Hamstring curls If told by your health care provider, do this exercise while wearing ankle weights. Begin with 5 lb weights. Then increase the weight by 1 lb (0.5 kg) increments. You can also use an exercise band Lie on your abdomen with your legs straight. Bend your left / right knee as far as you can without feeling pain. Keep your hips flat against the floor. Hold this position for 10 seconds. Slowly lower your leg to the starting position. Repeat 10 times. Complete this exercise 3-4 times per week.      Squats This exercise strengthens the muscles in front of your thigh and knee (quadriceps). Stand in front of a table,  with your feet and knees pointing straight ahead. You may rest your hands on the table for balance but not for support. Slowly bend your knees and lower your hips like you are going to sit in a chair. Keep your weight over your heels, not over your toes. Keep your lower legs upright so they are parallel with the table legs. Do not let your hips go lower than your knees. Do not bend lower than told by your health care provider. If your knee pain increases, do not bend as low. Hold the squat position for 10 seconds. Slowly push with your legs to return to standing. Do not use your hands to pull yourself to standing. Repeat 10 times. Complete this exercise 3-4 times per week .     Wall slides This exercise strengthens the muscles in front of your thigh and knee (quadriceps). Lean your back against a smooth wall or door, and walk your feet out 18-24 inches (46-61 cm) from it. Place your feet hip-width apart. Slowly slide down the wall or door until your knees bend 90 degrees. Keep your knees over your heels, not over your toes. Keep your knees in line with your hips. Hold this  position for 10 seconds. Repeat 10 times. Complete this exercise 3-4 times per week.      Straight leg raises This exercise strengthens the muscles that rotate the leg at the hip and move it away from your body (hip abductors). Lie on your side with your left / right leg in the top position. Lie so your head, shoulder, knee, and hip line up. You may bend your bottom knee to help you keep your balance. Roll your hips slightly forward so your hips are stacked directly over each other and your left / right knee is facing forward. Leading with your heel, lift your top leg 4-6 inches (10-15 cm). You should feel the muscles in your outer hip lifting. Do not let your foot drift forward. Do not let your knee roll toward the ceiling. Hold this position for 10 seconds. Slowly return your leg to the starting position. Let your muscles relax completely after each repetition. Repeat 10 times. Complete this exercise 3-4 times per week.      Straight leg raises This exercise stretches the muscles that move your hips away from the front of the pelvis (hip extensors). Lie on your abdomen on a firm surface. You can put a pillow under your hips if that is more comfortable. Tense the muscles in your buttocks and lift your left / right leg about 4-6 inches (10-15 cm). Keep your knee straight as you lift your leg. Hold this position for 10 seconds. Slowly lower your leg to the starting position. Let your leg relax completely after each repetition. Repeat 10 times. Complete this exercise 3-4 times per week.

## 2023-01-31 NOTE — Progress Notes (Signed)
New Patient Visit  Assessment: Victoria Brandt is a 47 y.o. female with the following: 1. Acute pain of left knee  Plan: ALYCIA LEUENBERGER has pain in her left knee.  She banged her left knee, a little over a month ago, continues to have pain.  Pain is primarily over the medial aspect of the knee.  She has tried a brace, medications, and intramuscular steroid injection.  Radiographs demonstrate some mild to moderate degenerative changes, primarily within the medial compartment.  She does have pain over the MCL, and has some pain with valgus stress, but the mechanism does not necessarily fit an MCL injury.  Regardless, I am recommending a steroid injection.  This was completed in clinic today.  She will return to clinic as needed.   Procedure note injection Left knee joint   Verbal consent was obtained to inject the left knee joint  Timeout was completed to confirm the site of injection.  The skin was prepped with alcohol and ethyl chloride was sprayed at the injection site.  A 21-gauge needle was used to inject 40 mg of Depo-Medrol and 1% lidocaine (4 cc) into the left knee using an anterolateral approach.  There were no complications. A sterile bandage was applied.     Follow-up: Return if symptoms worsen or fail to improve.  Subjective:  Chief Complaint  Patient presents with   Knee Pain    L knee for over a mo. Pt states she fell and hit knee on the side of bed. C/o being warm to the touch and swollen. Gets worse with the weather.     History of Present Illness: Victoria Brandt is a 47 y.o. female who presents for evaluation of left knee pain.  Approximate 1 month ago, she hit her knee on the edge of her bed, which was working.  She fell, and has pain.  Pain is primarily in the medial aspect of the left knee.  She notes some swelling.  She has tried a brace.  She is tried medicines.  She has tried gabapentin, without sustained improvements.  She had an intramuscular steroid  injection, which did not improve her pain.  It does affect her motion.  She struggles to walk normally.   Review of Systems: No fevers or chills No numbness or tingling No chest pain No shortness of breath No bowel or bladder dysfunction No GI distress No headaches   Medical History:  Past Medical History:  Diagnosis Date   Headache    Hyperlipidemia    Hypertension    Migraines     Past Surgical History:  Procedure Laterality Date   ABDOMINAL HYSTERECTOMY     TUBAL LIGATION      Family History  Problem Relation Age of Onset   Hypertension Mother    Rheum arthritis Father    Hypertension Father    Heart failure Sister    Migraines Sister    Hypertension Sister    Thyroid disease Sister    Breast cancer Cousin    Social History   Tobacco Use   Smoking status: Never   Smokeless tobacco: Never  Substance Use Topics   Alcohol use: No   Drug use: No    Allergies  Allergen Reactions   Bee Venom Shortness Of Breath and Swelling    Current Meds  Medication Sig   albuterol (VENTOLIN HFA) 108 (90 Base) MCG/ACT inhaler Inhale 2 puffs into the lungs every 6 (six) hours as needed for wheezing or shortness  of breath.   atorvastatin (LIPITOR) 20 MG tablet Take 1 tablet (20 mg total) by mouth daily.   benzonatate (TESSALON) 200 MG capsule Take 1 capsule (200 mg total) by mouth 2 (two) times daily as needed for cough.   budesonide-formoterol (SYMBICORT) 80-4.5 MCG/ACT inhaler Inhale 2 puffs into the lungs 2 (two) times daily.   gabapentin (NEURONTIN) 100 MG capsule Take 1 capsule (100 mg total) by mouth 3 (three) times daily.   losartan-hydrochlorothiazide (HYZAAR) 50-12.5 MG tablet Take 1 tablet by mouth in the evening   meloxicam (MOBIC) 7.5 MG tablet Take 1 tablet (7.5 mg total) by mouth daily.   montelukast (SINGULAIR) 10 MG tablet Take 1 tablet (10 mg total) by mouth at bedtime.   ondansetron (ZOFRAN) 4 MG tablet Take 1 tablet (4 mg total) by mouth every 8 (eight)  hours as needed for nausea or vomiting.   SUMAtriptan (IMITREX) 100 MG tablet Take 1 tablet (100 mg total) by mouth once as needed for up to 1 dose for migraine or headache. May repeat in 2 hours if headache persists or recurs.   Current Facility-Administered Medications for the 01/31/23 encounter (Office Visit) with Oliver Barre, MD  Medication   methylPREDNISolone acetate (DEPO-MEDROL) injection 40 mg    Objective: BP (!) 141/85   Pulse 75   Ht 5\' 3"  (1.6 m)   Wt 272 lb (123.4 kg)   BMI 48.18 kg/m   Physical Exam:  General: Alert and oriented. and No acute distress. Gait: Left sided antalgic gait.  Evaluation of the left knee demonstrates mild swelling.  No bruising.  Negative Lachman.  Range of motion from 0-100 degrees, with some pain at extremes.  Mild tenderness to palpation along the lateral joint line.  Tenderness palpation over the medial joint line.  No laxity with valgus stress, although she reacts in pain.  Toes warm and well-perfused.  IMAGING: I personally reviewed images previously obtained in clinic  X-rays of the left knee were previously obtained.  Mild loss of joint space within the medial compartment.  There are associated osteophytes off both the medial femoral condyle, as well as the medial tibial plateau.   New Medications:  No orders of the defined types were placed in this encounter.     Oliver Barre, MD  01/31/2023 10:17 AM

## 2023-02-01 ENCOUNTER — Other Ambulatory Visit: Payer: Self-pay | Admitting: Family Medicine

## 2023-02-10 DIAGNOSIS — M25562 Pain in left knee: Secondary | ICD-10-CM | POA: Diagnosis not present

## 2023-02-22 ENCOUNTER — Ambulatory Visit (HOSPITAL_COMMUNITY): Payer: 59

## 2023-02-23 ENCOUNTER — Encounter: Payer: Self-pay | Admitting: *Deleted

## 2023-04-14 IMAGING — DX DG FACIAL BONES COMPLETE 3+V
6 series · 6 of 6 positions shown · non-contrast
Comparison: None.

CLINICAL DATA: Contusion of cheek, initial encounter

EXAM:
FACIAL BONES COMPLETE 3+V

[view not recorded (1 of 6)]
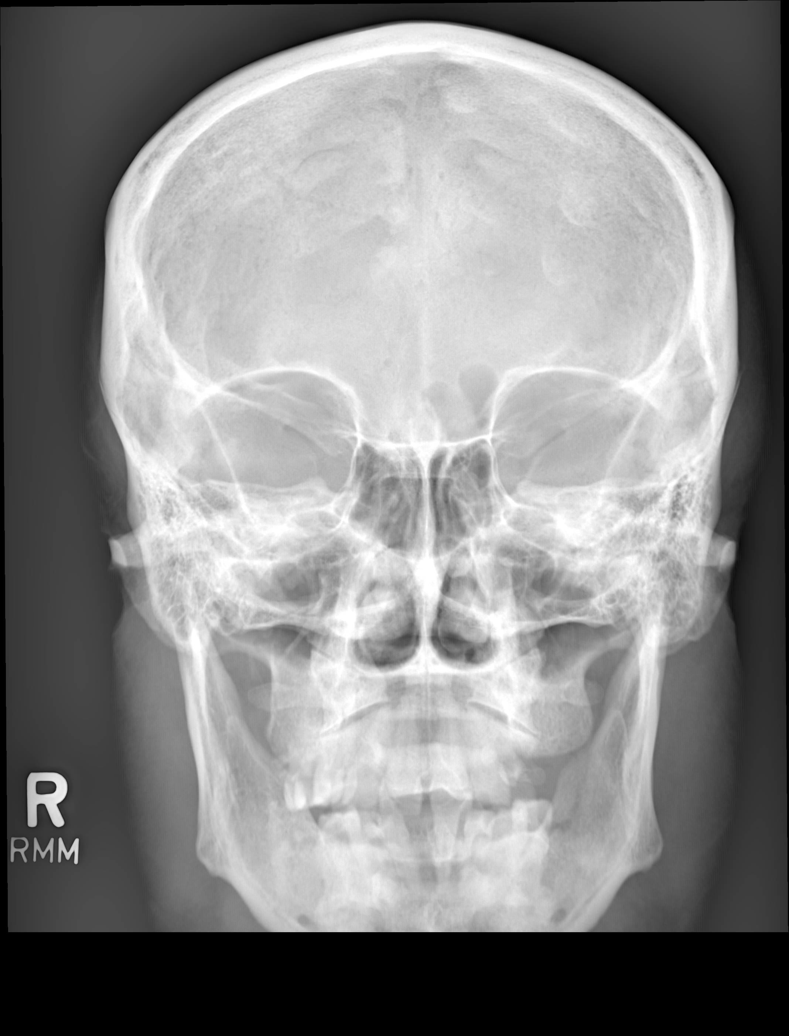

[view not recorded (2 of 6)]
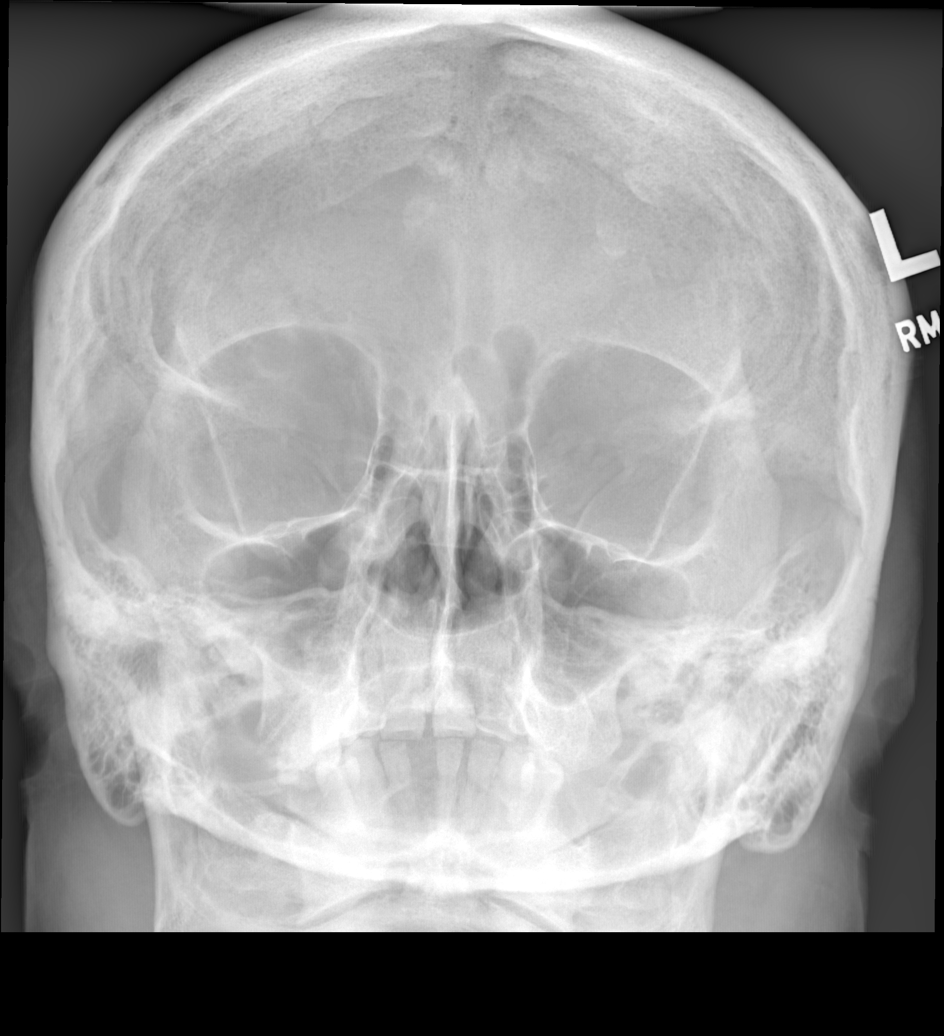

[view not recorded (3 of 6)]
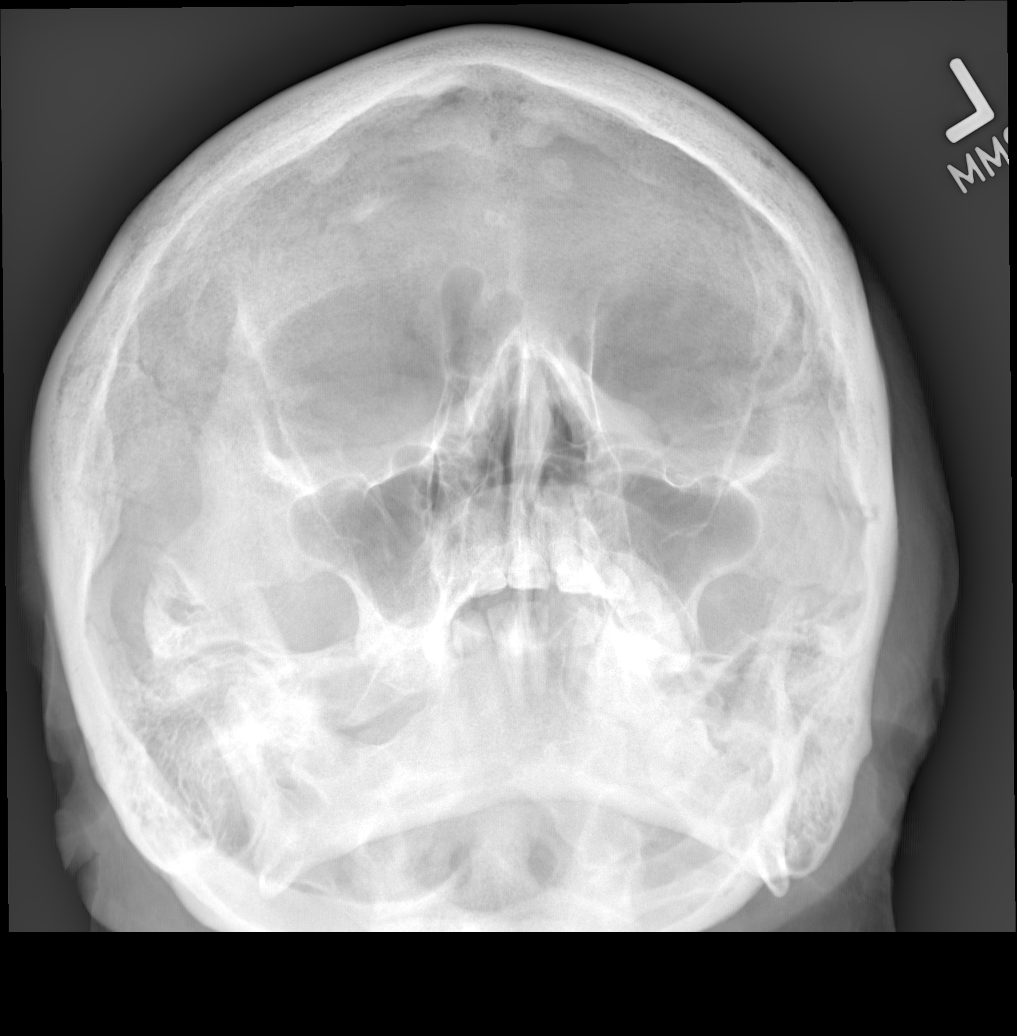

[view not recorded (4 of 6)]
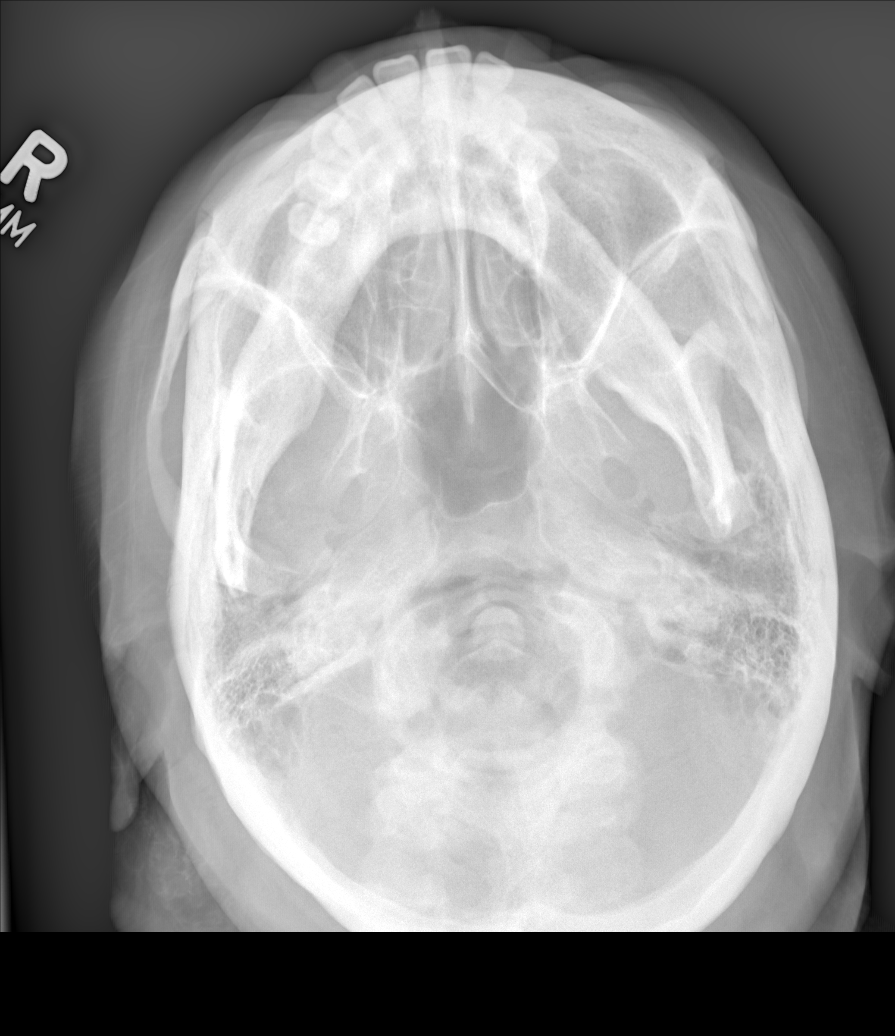

[view not recorded (5 of 6)]
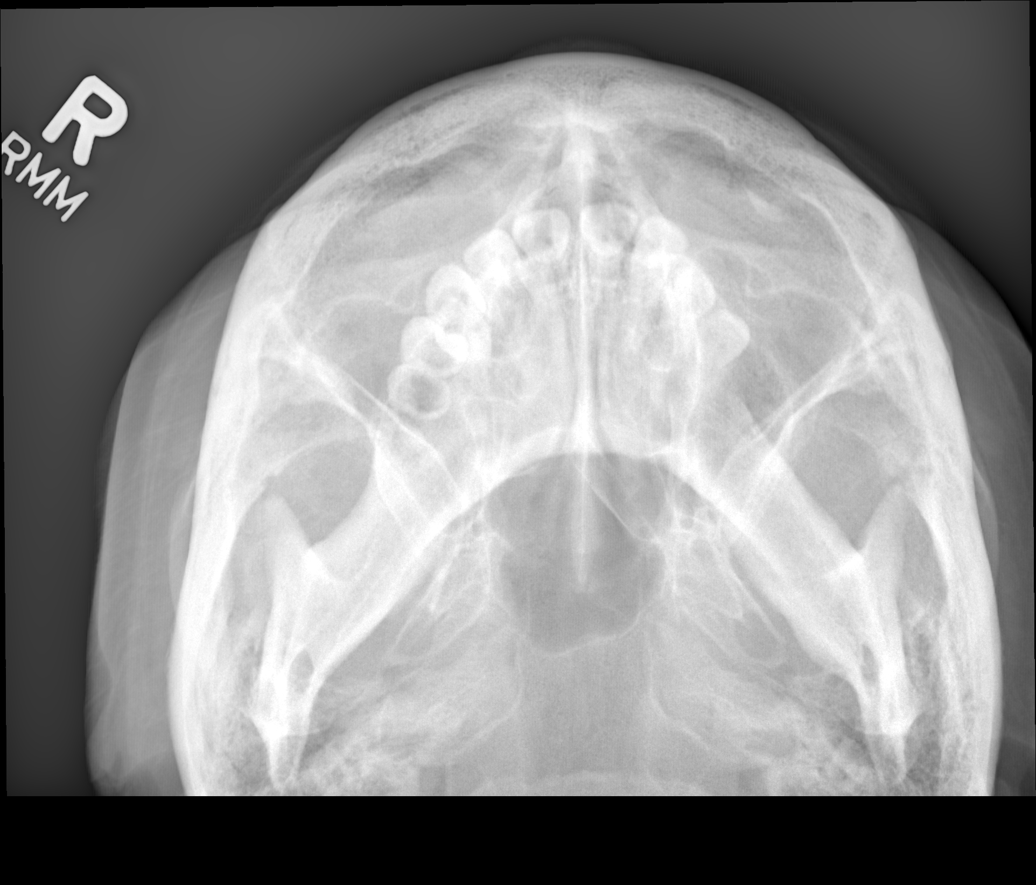

[view not recorded (6 of 6)]
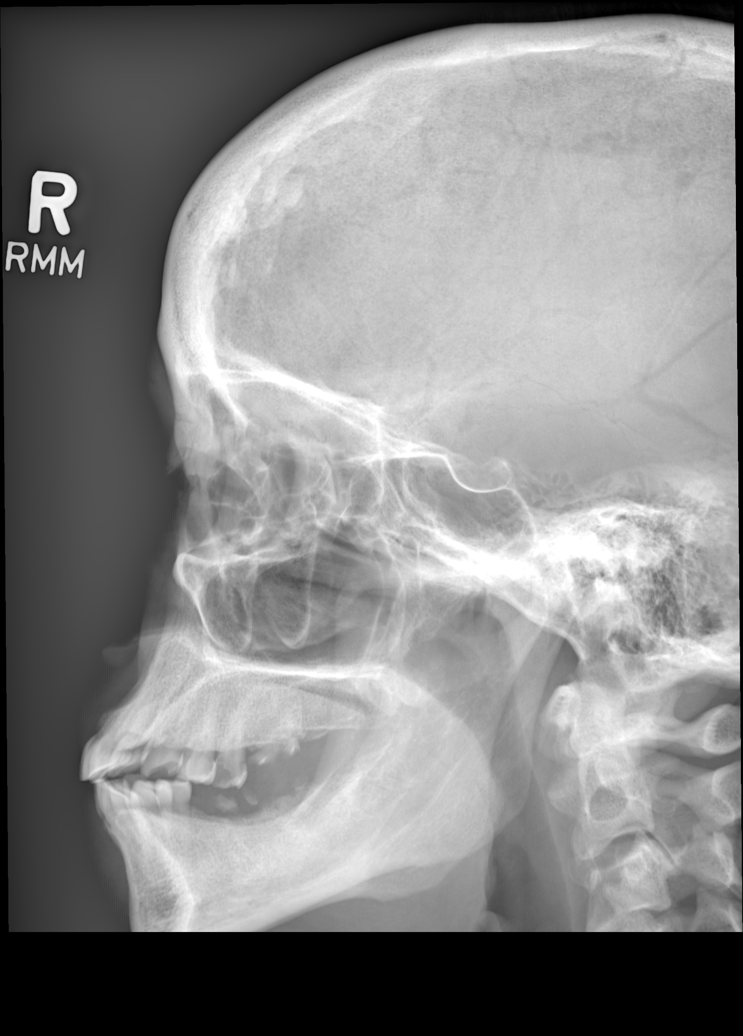

[6 of 6 positions shown; findings below may reference images not displayed]

FINDINGS: There is no evidence of acute displaced fracture or other
significant bone abnormality. No orbital emphysema or sinus
air-fluid levels are seen. No retained radiopaque foreign body.
IMPRESSION: Negative.

## 2023-04-21 ENCOUNTER — Ambulatory Visit: Payer: 59 | Admitting: Family Medicine

## 2023-05-08 ENCOUNTER — Encounter: Payer: Self-pay | Admitting: Family Medicine

## 2023-05-22 NOTE — Telephone Encounter (Signed)
 Pt advised with verbal understanding that she should hear back sometime this week

## 2023-06-09 ENCOUNTER — Ambulatory Visit: Admitting: Family Medicine

## 2023-07-21 ENCOUNTER — Encounter: Payer: Self-pay | Admitting: Family Medicine

## 2023-07-21 ENCOUNTER — Telehealth: Payer: Self-pay | Admitting: Family Medicine

## 2023-07-21 ENCOUNTER — Ambulatory Visit (INDEPENDENT_AMBULATORY_CARE_PROVIDER_SITE_OTHER): Payer: Self-pay | Admitting: Family Medicine

## 2023-07-21 VITALS — BP 138/85 | HR 79 | Ht 63.0 in | Wt 270.1 lb

## 2023-07-21 DIAGNOSIS — M25562 Pain in left knee: Secondary | ICD-10-CM

## 2023-07-21 DIAGNOSIS — E559 Vitamin D deficiency, unspecified: Secondary | ICD-10-CM | POA: Diagnosis not present

## 2023-07-21 DIAGNOSIS — R7303 Prediabetes: Secondary | ICD-10-CM | POA: Diagnosis not present

## 2023-07-21 DIAGNOSIS — I10 Essential (primary) hypertension: Secondary | ICD-10-CM | POA: Diagnosis not present

## 2023-07-21 DIAGNOSIS — A184 Tuberculosis of skin and subcutaneous tissue: Secondary | ICD-10-CM

## 2023-07-21 DIAGNOSIS — E038 Other specified hypothyroidism: Secondary | ICD-10-CM | POA: Diagnosis not present

## 2023-07-21 DIAGNOSIS — A159 Respiratory tuberculosis unspecified: Secondary | ICD-10-CM

## 2023-07-21 DIAGNOSIS — J40 Bronchitis, not specified as acute or chronic: Secondary | ICD-10-CM

## 2023-07-21 DIAGNOSIS — M7989 Other specified soft tissue disorders: Secondary | ICD-10-CM

## 2023-07-21 MED ORDER — MELOXICAM 7.5 MG PO TABS: 7.5000 mg | ORAL_TABLET | Freq: Every day | ORAL | 2 refills | Status: AC

## 2023-07-21 MED ORDER — BENZONATATE 200 MG PO CAPS: 200.0000 mg | ORAL_CAPSULE | Freq: Two times a day (BID) | ORAL | 2 refills | Status: AC | PRN

## 2023-07-21 MED ORDER — SUMATRIPTAN SUCCINATE 100 MG PO TABS: 100.0000 mg | ORAL_TABLET | Freq: Once | ORAL | 3 refills | Status: AC | PRN

## 2023-07-21 MED ORDER — ONDANSETRON HCL 4 MG PO TABS: 4.0000 mg | ORAL_TABLET | Freq: Three times a day (TID) | ORAL | 3 refills | Status: AC | PRN

## 2023-07-21 MED ORDER — ATORVASTATIN CALCIUM 20 MG PO TABS: 20.0000 mg | ORAL_TABLET | Freq: Every day | ORAL | 3 refills | Status: AC

## 2023-07-21 MED ORDER — ALBUTEROL SULFATE HFA 108 (90 BASE) MCG/ACT IN AERS: 2.0000 | INHALATION_SPRAY | Freq: Four times a day (QID) | RESPIRATORY_TRACT | 6 refills | Status: AC | PRN

## 2023-07-21 MED ORDER — FUROSEMIDE 20 MG PO TABS: 20.0000 mg | ORAL_TABLET | ORAL | 3 refills | Status: AC

## 2023-07-21 MED ORDER — GABAPENTIN 100 MG PO CAPS: 100.0000 mg | ORAL_CAPSULE | Freq: Three times a day (TID) | ORAL | 3 refills | Status: AC

## 2023-07-21 MED ORDER — LOSARTAN POTASSIUM-HCTZ 50-12.5 MG PO TABS: 1.0000 | ORAL_TABLET | Freq: Every evening | ORAL | 6 refills | Status: AC

## 2023-07-21 MED ORDER — MONTELUKAST SODIUM 10 MG PO TABS: 10.0000 mg | ORAL_TABLET | Freq: Every day | ORAL | 3 refills | Status: AC

## 2023-07-21 NOTE — Telephone Encounter (Signed)
 Copied from CRM [id number]. Topic: Appointments - Scheduling Inquiry for Clinic >> Jul 21, 2023 10:48 AM Donald Frost wrote: Reason for CRM: The patient called in stating she and her husband have appts later today and her insurance is not active yet. She questioned how much they would owe. I called and spoke with Caelyn and she told me $93.60 a piece or they can opt out and get the bill and send it to the insurance to see if they will retro pay for it. I informed the patient and she will contact the insurance before coming to the appt today to find out what they will do when they get there.

## 2023-07-21 NOTE — Patient Instructions (Signed)

## 2023-07-21 NOTE — Assessment & Plan Note (Signed)
 Continue Losartan -hydrochlorothiazide 50-25 mg once daily Labs ordered. Discussed with  patient to monitor their blood pressure regularly and maintain a heart-healthy diet rich in fruits, vegetables, whole grains, and low-fat dairy, while reducing sodium intake to less than 2,300 mg per day. Regular physical activity, such as 30 minutes of moderate exercise most days of the week, will help lower blood pressure and improve overall cardiovascular health. Avoiding smoking, limiting alcohol consumption, and managing stress. Take  prescribed medication, & take it as directed and avoid skipping doses. Seek emergency care if your blood pressure is (over 180/100) or you experience chest pain, shortness of breath, or sudden vision changes.Patient verbalizes understanding regarding plan of care and all questions answered.

## 2023-07-21 NOTE — Assessment & Plan Note (Signed)
 Bilateral leg swelling with calf pain Trial Lasix 20 mg  Ordered US  Venous Doppler  Awaiting results will treat accordingly.  Advise patient to visit ED if notice any new alarming symptoms such as chest pain, shortness of breath, headaches, or dizziness. Discussed do not stand or sit in on spot for a long time, Avoid tight clothing that restricts blood flow in your legs. Increase fluid intake with water to avoid dehydration .

## 2023-07-21 NOTE — Progress Notes (Signed)
 Established Patient Office Visit   Subjective  Patient ID: Victoria Brandt, female    DOB: Nov 21, 1975  Age: 48 y.o. MRN: [id number]  Chief Complaint  Patient presents with   Medical Management of Chronic Issues    Pt states, refills, "swelling in her feet and ankles" and "needs tb she has someone at work that can read it they just dont give it"    She  has a past medical history of Headache, Hyperlipidemia, Hypertension, and Migraines.  HPI Patient presents to the clinic for chronic follow up. For the details of today's visit, please refer to assessment and plan.  Review of Systems  Constitutional:  Negative for chills and fever.  Eyes:  Negative for blurred vision.  Respiratory:  Negative for shortness of breath.   Cardiovascular:  Positive for leg swelling. Negative for chest pain.  Gastrointestinal:  Negative for abdominal pain.  Neurological:  Negative for dizziness and headaches.      Objective:     BP 138/85   Pulse 79   Ht 5\' 3"  (1.6 m)   Wt 270 lb 1.3 oz (122.5 kg)   SpO2 97%   BMI 47.84 kg/m  BP Readings from Last 3 Encounters:  07/21/23 138/85  01/31/23 (!) 141/85  12/22/22 (!) 166/84      Physical Exam Vitals reviewed.  Constitutional:      General: She is not in acute distress.    Appearance: Normal appearance. She is not ill-appearing, toxic-appearing or diaphoretic.  HENT:     Head: Normocephalic.  Eyes:     General:        Right eye: No discharge.        Left eye: No discharge.     Conjunctiva/sclera: Conjunctivae normal.  Cardiovascular:     Rate and Rhythm: Normal rate.     Pulses: Normal pulses.     Heart sounds: Normal heart sounds.  Pulmonary:     Effort: Pulmonary effort is normal. No respiratory distress.     Breath sounds: Normal breath sounds.  Musculoskeletal:        General: Swelling and tenderness present. Normal range of motion.     Cervical back: Normal range of motion.     Right lower leg: Edema present.     Left lower  leg: Edema present.  Skin:    General: Skin is warm and dry.     Capillary Refill: Capillary refill takes less than 2 seconds.  Neurological:     Mental Status: She is alert.     Coordination: Coordination normal.     Gait: Gait normal.  Psychiatric:        Mood and Affect: Mood normal.        Behavior: Behavior normal.      No results found for any visits on 07/21/23.  The 10-year ASCVD risk score (Arnett DK, et al., 2019) is: 2.3%    Assessment & Plan:  Prediabetes -     Hemoglobin A1c -     Gabapentin ; Take 1 capsule (100 mg total) by mouth 3 (three) times daily.  Dispense: 90 capsule; Refill: 3  Primary hypertension Assessment & Plan: Continue Losartan -hydrochlorothiazide 50-25 mg once daily Labs ordered. Discussed with  patient to monitor their blood pressure regularly and maintain a heart-healthy diet rich in fruits, vegetables, whole grains, and low-fat dairy, while reducing sodium intake to less than 2,300 mg per day. Regular physical activity, such as 30 minutes of moderate exercise most days of the week,  will help lower blood pressure and improve overall cardiovascular health. Avoiding smoking, limiting alcohol consumption, and managing stress. Take  prescribed medication, & take it as directed and avoid skipping doses. Seek emergency care if your blood pressure is (over 180/100) or you experience chest pain, shortness of breath, or sudden vision changes.Patient verbalizes understanding regarding plan of care and all questions answered.   Orders: -     BMP8+eGFR -     CBC with Differential/Platelet -     Lipid panel -     Losartan  Potassium-HCTZ; Take 1 tablet by mouth every evening.  Dispense: 30 tablet; Refill: 6  Vitamin D  deficiency -     VITAMIN D  25 Hydroxy (Vit-D Deficiency, Fractures)  TSH (thyroid-stimulating hormone deficiency) -     TSH + free T4  Bronchitis -     Albuterol  Sulfate HFA; Inhale 2 puffs into the lungs every 6 (six) hours as needed for  wheezing or shortness of breath.  Dispense: 8 g; Refill: 6 -     Benzonatate ; Take 1 capsule (200 mg total) by mouth 2 (two) times daily as needed for cough.  Dispense: 20 capsule; Refill: 2  Acute pain of left knee -     Meloxicam ; Take 1 tablet (7.5 mg total) by mouth daily.  Dispense: 30 tablet; Refill: 2  Tuberculid  Tuberculosis -     QuantiFERON-TB Gold Plus  Leg swelling Assessment & Plan: Bilateral leg swelling with calf pain Trial Lasix 20 mg  Ordered US  Venous Doppler  Awaiting results will treat accordingly.  Advise patient to visit ED if notice any new alarming symptoms such as chest pain, shortness of breath, headaches, or dizziness. Discussed do not stand or sit in on spot for a long time, Avoid tight clothing that restricts blood flow in your legs. Increase fluid intake with water to avoid dehydration .   Orders: -     Furosemide; Take 1 tablet (20 mg total) by mouth every other day. Leg swelling  Dispense: 30 tablet; Refill: 3 -     US  Venous Img Lower Bilateral (DVT); Future  Other orders -     Atorvastatin  Calcium ; Take 1 tablet (20 mg total) by mouth daily.  Dispense: 90 tablet; Refill: 3 -     Montelukast  Sodium; Take 1 tablet (10 mg total) by mouth at bedtime.  Dispense: 30 tablet; Refill: 3 -     Ondansetron  HCl; Take 1 tablet (4 mg total) by mouth every 8 (eight) hours as needed for nausea or vomiting.  Dispense: 20 tablet; Refill: 3 -     SUMAtriptan  Succinate; Take 1 tablet (100 mg total) by mouth once as needed for up to 1 dose for migraine or headache. May repeat in 2 hours if headache persists or recurs.  Dispense: 10 tablet; Refill: 3    Return in about 4 months (around 11/20/2023), or if symptoms worsen or fail to improve, for chronic follow-up.   Avelino Lek Amber Bail, FNP

## 2023-07-25 ENCOUNTER — Ambulatory Visit: Payer: Self-pay | Admitting: Family Medicine

## 2023-07-26 LAB — QUANTIFERON-TB GOLD PLUS
QuantiFERON Nil Value: 0.17 [IU]/mL
QuantiFERON TB1 Ag Value: 0.1 [IU]/mL
QuantiFERON TB2 Ag Value: 0.1 [IU]/mL

## 2023-07-26 LAB — CBC WITH DIFFERENTIAL/PLATELET
Basophils Absolute: 0 10*3/uL (ref 0.0–0.2)
Basos: 0 %
EOS (ABSOLUTE): 0.5 10*3/uL — ABNORMAL HIGH (ref 0.0–0.4)
Eos: 5 %
Hematocrit: 40.5 % (ref 34.0–46.6)
Hemoglobin: 13.2 g/dL (ref 11.1–15.9)
Immature Grans (Abs): 0.1 10*3/uL (ref 0.0–0.1)
Immature Granulocytes: 1 %
Lymphocytes Absolute: 2.9 10*3/uL (ref 0.7–3.1)
Lymphs: 31 %
MCH: 27.2 pg (ref 26.6–33.0)
MCHC: 32.6 g/dL (ref 31.5–35.7)
MCV: 83 fL (ref 79–97)
Monocytes Absolute: 0.5 10*3/uL (ref 0.1–0.9)
Monocytes: 5 %
Neutrophils Absolute: 5.4 10*3/uL (ref 1.4–7.0)
Neutrophils: 58 %
Platelets: 284 10*3/uL (ref 150–450)
RBC: 4.86 x10E6/uL (ref 3.77–5.28)
RDW: 13.3 % (ref 11.7–15.4)
WBC: 9.4 10*3/uL (ref 3.4–10.8)

## 2023-07-26 LAB — BMP8+EGFR
BUN/Creatinine Ratio: 8 — ABNORMAL LOW (ref 9–23)
BUN: 6 mg/dL (ref 6–24)
CO2: 23 mmol/L (ref 20–29)
Calcium: 9.1 mg/dL (ref 8.7–10.2)
Chloride: 102 mmol/L (ref 96–106)
Creatinine, Ser: 0.72 mg/dL (ref 0.57–1.00)
Glucose: 80 mg/dL (ref 70–99)
Potassium: 4.5 mmol/L (ref 3.5–5.2)
Sodium: 138 mmol/L (ref 134–144)
eGFR: 102 mL/min/{1.73_m2} (ref >59)

## 2023-07-26 LAB — LIPID PANEL
Chol/HDL Ratio: 4.2 ratio (ref 0.0–4.4)
Cholesterol, Total: 167 mg/dL (ref 100–199)
HDL: 40 mg/dL (ref >39)
LDL Chol Calc (NIH): 98 mg/dL (ref 0–99)
Triglycerides: 164 mg/dL — ABNORMAL HIGH (ref 0–149)
VLDL Cholesterol Cal: 29 mg/dL (ref 5–40)

## 2023-07-26 LAB — TSH+FREE T4
Free T4: 1.15 ng/dL (ref 0.82–1.77)
TSH: 1.69 u[IU]/mL (ref 0.450–4.500)

## 2023-07-26 LAB — VITAMIN D 25 HYDROXY (VIT D DEFICIENCY, FRACTURES): Vit D, 25-Hydroxy: 33.4 ng/mL (ref 30.0–100.0)

## 2023-07-26 LAB — HEMOGLOBIN A1C
Est. average glucose Bld gHb Est-mCnc: 114 mg/dL
Hgb A1c MFr Bld: 5.6 % (ref 4.8–5.6)

## 2023-11-24 ENCOUNTER — Ambulatory Visit: Admitting: Family Medicine

## 2023-11-24 ENCOUNTER — Encounter: Payer: Self-pay | Admitting: Family Medicine

## 2024-05-21 ENCOUNTER — Ambulatory Visit: Payer: Self-pay | Admitting: Family Medicine
